# Patient Record
Sex: Male | Born: 1935 | Race: White | Hispanic: No | Marital: Married
Health system: Southern US, Community
[De-identification: ages and names within clinical notes are randomized; demographics above are authoritative.]

## PROBLEM LIST (undated history)

## (undated) DIAGNOSIS — I639 Cerebral infarction, unspecified: Secondary | ICD-10-CM

## (undated) DIAGNOSIS — K219 Gastro-esophageal reflux disease without esophagitis: Secondary | ICD-10-CM

## (undated) DIAGNOSIS — J9690 Respiratory failure, unspecified, unspecified whether with hypoxia or hypercapnia: Secondary | ICD-10-CM

## (undated) DIAGNOSIS — E119 Type 2 diabetes mellitus without complications: Secondary | ICD-10-CM

## (undated) HISTORY — PX: TRACHEOSTOMY: SUR1362

---

## 2009-12-30 DEATH — deceased

## 2017-02-26 ENCOUNTER — Other Ambulatory Visit (HOSPITAL_COMMUNITY): Payer: Medicare Other

## 2017-02-26 ENCOUNTER — Institutional Professional Consult (permissible substitution) (HOSPITAL_COMMUNITY): Payer: Medicare Other

## 2017-02-26 ENCOUNTER — Inpatient Hospital Stay
Admission: RE | Admit: 2017-02-26 | Discharge: 2017-03-30 | Disposition: A | Discharge status: Skilled Nursing Facility | Payer: Medicare Other | Attending: Internal Medicine | Admitting: Internal Medicine

## 2017-02-26 DIAGNOSIS — K567 Ileus, unspecified: Secondary | ICD-10-CM

## 2017-02-26 DIAGNOSIS — R509 Fever, unspecified: Secondary | ICD-10-CM

## 2017-02-26 DIAGNOSIS — N179 Acute kidney failure, unspecified: Secondary | ICD-10-CM

## 2017-02-26 DIAGNOSIS — J189 Pneumonia, unspecified organism: Secondary | ICD-10-CM

## 2017-02-26 DIAGNOSIS — J9690 Respiratory failure, unspecified, unspecified whether with hypoxia or hypercapnia: Secondary | ICD-10-CM

## 2017-02-26 DIAGNOSIS — Z931 Gastrostomy status: Secondary | ICD-10-CM

## 2017-02-26 DIAGNOSIS — R140 Abdominal distension (gaseous): Secondary | ICD-10-CM

## 2017-02-26 DIAGNOSIS — Z431 Encounter for attention to gastrostomy: Secondary | ICD-10-CM

## 2017-02-26 LAB — CBC WITH DIFFERENTIAL/PLATELET
BASOS ABS: 0 10*3/uL (ref 0.0–0.1)
Basophils Relative: 0 %
EOS ABS: 0.1 10*3/uL (ref 0.0–0.7)
Eosinophils Relative: 1 %
HCT: 35.8 % — ABNORMAL LOW (ref 39.0–52.0)
HEMOGLOBIN: 11.4 g/dL — AB (ref 13.0–17.0)
LYMPHS ABS: 1.2 10*3/uL (ref 0.7–4.0)
LYMPHS PCT: 11 %
MCH: 30 pg (ref 26.0–34.0)
MCHC: 31.8 g/dL (ref 30.0–36.0)
MCV: 94.2 fL (ref 78.0–100.0)
Monocytes Absolute: 0.9 10*3/uL (ref 0.1–1.0)
Monocytes Relative: 8 %
NEUTROS PCT: 80 %
Neutro Abs: 8.7 10*3/uL — ABNORMAL HIGH (ref 1.7–7.7)
PLATELETS: 151 10*3/uL (ref 150–400)
RBC: 3.8 MIL/uL — AB (ref 4.22–5.81)
RDW: 13.5 % (ref 11.5–15.5)
WBC: 11 10*3/uL — AB (ref 4.0–10.5)

## 2017-02-26 LAB — PROTIME-INR
INR: 1.34
Prothrombin Time: 16.4 seconds — ABNORMAL HIGH (ref 11.4–15.2)

## 2017-02-26 LAB — COMPREHENSIVE METABOLIC PANEL WITH GFR
ALT: 232 U/L — ABNORMAL HIGH (ref 17–63)
AST: 263 U/L — ABNORMAL HIGH (ref 15–41)
Albumin: 2.4 g/dL — ABNORMAL LOW (ref 3.5–5.0)
Alkaline Phosphatase: 84 U/L (ref 38–126)
Anion gap: 6 (ref 5–15)
BUN: 17 mg/dL (ref 6–20)
CO2: 28 mmol/L (ref 22–32)
Calcium: 8.2 mg/dL — ABNORMAL LOW (ref 8.9–10.3)
Chloride: 111 mmol/L (ref 101–111)
Creatinine, Ser: 0.74 mg/dL (ref 0.61–1.24)
GFR calc Af Amer: >60 mL/min
GFR calc non Af Amer: >60 mL/min
Glucose, Bld: 166 mg/dL — ABNORMAL HIGH (ref 65–99)
Potassium: 3.1 mmol/L — ABNORMAL LOW (ref 3.5–5.1)
Sodium: 145 mmol/L (ref 135–145)
Total Bilirubin: 1.1 mg/dL (ref 0.3–1.2)
Total Protein: 5.9 g/dL — ABNORMAL LOW (ref 6.5–8.1)

## 2017-02-26 LAB — MAGNESIUM: Magnesium: 1.9 mg/dL (ref 1.7–2.4)

## 2017-02-26 LAB — APTT: aPTT: 36 s (ref 24–36)

## 2017-02-26 LAB — PHOSPHORUS: Phosphorus: 3 mg/dL (ref 2.5–4.6)

## 2017-02-26 MED ORDER — IOPAMIDOL (ISOVUE-300) INJECTION 61%
INTRAVENOUS | Status: AC
  Administered 2017-02-26: 30 mL via GASTROSTOMY
  Filled 2017-02-26: qty 50

## 2017-02-27 LAB — COMPREHENSIVE METABOLIC PANEL
ALBUMIN: 2.4 g/dL — AB (ref 3.5–5.0)
ALK PHOS: 83 U/L (ref 38–126)
ALT: 202 U/L — ABNORMAL HIGH (ref 17–63)
ANION GAP: 6 (ref 5–15)
AST: 134 U/L — ABNORMAL HIGH (ref 15–41)
BILIRUBIN TOTAL: 0.7 mg/dL (ref 0.3–1.2)
BUN: 16 mg/dL (ref 6–20)
CALCIUM: 8.3 mg/dL — AB (ref 8.9–10.3)
CO2: 31 mmol/L (ref 22–32)
Chloride: 110 mmol/L (ref 101–111)
Creatinine, Ser: 0.8 mg/dL (ref 0.61–1.24)
GLUCOSE: 189 mg/dL — AB (ref 65–99)
POTASSIUM: 3.3 mmol/L — AB (ref 3.5–5.1)
Sodium: 147 mmol/L — ABNORMAL HIGH (ref 135–145)
TOTAL PROTEIN: 6.1 g/dL — AB (ref 6.5–8.1)

## 2017-02-27 LAB — CBC
HEMATOCRIT: 37.4 % — AB (ref 39.0–52.0)
Hemoglobin: 12 g/dL — ABNORMAL LOW (ref 13.0–17.0)
MCH: 30.8 pg (ref 26.0–34.0)
MCHC: 32.1 g/dL (ref 30.0–36.0)
MCV: 96.1 fL (ref 78.0–100.0)
Platelets: 173 10*3/uL (ref 150–400)
RBC: 3.89 MIL/uL — ABNORMAL LOW (ref 4.22–5.81)
RDW: 13.6 % (ref 11.5–15.5)
WBC: 14.4 10*3/uL — ABNORMAL HIGH (ref 4.0–10.5)

## 2017-02-27 LAB — HEMOGLOBIN A1C
HEMOGLOBIN A1C: 5.9 % — AB (ref 4.8–5.6)
Mean Plasma Glucose: 122.63 mg/dL

## 2017-02-27 LAB — TSH: TSH: 1.026 u[IU]/mL (ref 0.350–4.500)

## 2017-02-27 LAB — MAGNESIUM: MAGNESIUM: 2.1 mg/dL (ref 1.7–2.4)

## 2017-02-27 LAB — PHOSPHORUS: PHOSPHORUS: 2.4 mg/dL — AB (ref 2.5–4.6)

## 2017-02-28 LAB — POTASSIUM: Potassium: 3.1 mmol/L — ABNORMAL LOW (ref 3.5–5.1)

## 2017-02-28 LAB — CBC
HEMATOCRIT: 37.8 % — AB (ref 39.0–52.0)
HEMOGLOBIN: 12.3 g/dL — AB (ref 13.0–17.0)
MCH: 31.5 pg (ref 26.0–34.0)
MCHC: 32.5 g/dL (ref 30.0–36.0)
MCV: 96.7 fL (ref 78.0–100.0)
Platelets: 177 10*3/uL (ref 150–400)
RBC: 3.91 MIL/uL — ABNORMAL LOW (ref 4.22–5.81)
RDW: 13.7 % (ref 11.5–15.5)
WBC: 14.8 10*3/uL — AB (ref 4.0–10.5)

## 2017-02-28 LAB — BASIC METABOLIC PANEL
ANION GAP: 8 (ref 5–15)
BUN: 22 mg/dL — ABNORMAL HIGH (ref 6–20)
CALCIUM: 7.9 mg/dL — AB (ref 8.9–10.3)
CHLORIDE: 110 mmol/L (ref 101–111)
CO2: 30 mmol/L (ref 22–32)
Creatinine, Ser: 1.38 mg/dL — ABNORMAL HIGH (ref 0.61–1.24)
GFR calc non Af Amer: 45 mL/min — ABNORMAL LOW (ref >60)
GFR, EST AFRICAN AMERICAN: 53 mL/min — AB (ref >60)
Glucose, Bld: 173 mg/dL — ABNORMAL HIGH (ref 65–99)
Potassium: 2.9 mmol/L — ABNORMAL LOW (ref 3.5–5.1)
SODIUM: 148 mmol/L — AB (ref 135–145)

## 2017-02-28 LAB — VANCOMYCIN, TROUGH: VANCOMYCIN TR: 37 ug/mL — AB (ref 15–20)

## 2017-02-28 LAB — MAGNESIUM: MAGNESIUM: 2 mg/dL (ref 1.7–2.4)

## 2017-02-28 LAB — PHOSPHORUS: PHOSPHORUS: 2.3 mg/dL — AB (ref 2.5–4.6)

## 2017-02-28 MED ORDER — GENERIC EXTERNAL MEDICATION
1500.00 | Status: DC
Start: 2017-02-26 — End: 2017-02-28

## 2017-02-28 MED ORDER — GLYCOPYRROLATE 0.2 MG/ML IJ SOLN
0.20 | INTRAMUSCULAR | Status: DC
Start: 2017-02-26 — End: 2017-02-28

## 2017-02-28 MED ORDER — LABETALOL HCL 5 MG/ML IV SOLN
10.00 | INTRAVENOUS | Status: DC
Start: ? — End: 2017-02-28

## 2017-02-28 MED ORDER — POLYVINYL ALCOHOL-POVIDONE PF 1.4-0.6 % OP SOLN
1.00 | OPHTHALMIC | Status: DC
Start: ? — End: 2017-02-28

## 2017-02-28 MED ORDER — ACETAMINOPHEN 650 MG RE SUPP
650.00 | RECTAL | Status: DC
Start: ? — End: 2017-02-28

## 2017-02-28 MED ORDER — ONDANSETRON HCL 4 MG/2ML IJ SOLN
4.00 | INTRAMUSCULAR | Status: DC
Start: ? — End: 2017-02-28

## 2017-02-28 MED ORDER — SODIUM CHLORIDE 0.65 % NA SOLN
1.00 | NASAL | Status: DC
Start: ? — End: 2017-02-28

## 2017-02-28 MED ORDER — BISACODYL 10 MG RE SUPP
10.00 | RECTAL | Status: DC
Start: ? — End: 2017-02-28

## 2017-02-28 MED ORDER — FENTANYL CITRATE (PF) 2500 MCG/50ML IJ SOLN
25.00 | INTRAMUSCULAR | Status: DC
Start: ? — End: 2017-02-28

## 2017-02-28 MED ORDER — GENERIC EXTERNAL MEDICATION
25.00 | Status: DC
Start: ? — End: 2017-02-28

## 2017-02-28 MED ORDER — GENERIC EXTERNAL MEDICATION
3.38 | Status: DC
Start: 2017-02-26 — End: 2017-02-28

## 2017-02-28 MED ORDER — ENOXAPARIN SODIUM 40 MG/0.4ML ~~LOC~~ SOLN
40.00 | SUBCUTANEOUS | Status: DC
Start: 2017-02-26 — End: 2017-02-28

## 2017-02-28 MED ORDER — GENERIC EXTERNAL MEDICATION
Status: DC
Start: ? — End: 2017-02-28

## 2017-02-28 MED ORDER — ASPIRIN 300 MG RE SUPP
300.00 | RECTAL | Status: DC
Start: 2017-02-26 — End: 2017-02-28

## 2017-02-28 MED ORDER — VANCOMYCIN HCL 10 G IV SOLR
1.00 | INTRAVENOUS | Status: DC
Start: ? — End: 2017-02-28

## 2017-02-28 MED ORDER — PANTOPRAZOLE SODIUM 40 MG IV SOLR
40.00 | INTRAVENOUS | Status: DC
Start: 2017-02-26 — End: 2017-02-28

## 2017-03-01 LAB — CULTURE, RESPIRATORY W GRAM STAIN: Culture: NORMAL

## 2017-03-01 LAB — BASIC METABOLIC PANEL
Anion gap: 9 (ref 5–15)
BUN: 22 mg/dL — AB (ref 6–20)
CHLORIDE: 111 mmol/L (ref 101–111)
CO2: 27 mmol/L (ref 22–32)
Calcium: 7.9 mg/dL — ABNORMAL LOW (ref 8.9–10.3)
Creatinine, Ser: 1.32 mg/dL — ABNORMAL HIGH (ref 0.61–1.24)
GFR calc Af Amer: 55 mL/min — ABNORMAL LOW (ref >60)
GFR calc non Af Amer: 48 mL/min — ABNORMAL LOW (ref >60)
GLUCOSE: 178 mg/dL — AB (ref 65–99)
POTASSIUM: 3 mmol/L — AB (ref 3.5–5.1)
Sodium: 147 mmol/L — ABNORMAL HIGH (ref 135–145)

## 2017-03-01 LAB — CULTURE, RESPIRATORY: SPECIAL REQUESTS: NORMAL

## 2017-03-01 LAB — VANCOMYCIN, TROUGH: Vancomycin Tr: 14 ug/mL — ABNORMAL LOW (ref 15–20)

## 2017-03-02 LAB — BASIC METABOLIC PANEL
ANION GAP: 5 (ref 5–15)
BUN: 24 mg/dL — ABNORMAL HIGH (ref 6–20)
CO2: 33 mmol/L — ABNORMAL HIGH (ref 22–32)
Calcium: 7.9 mg/dL — ABNORMAL LOW (ref 8.9–10.3)
Chloride: 114 mmol/L — ABNORMAL HIGH (ref 101–111)
Creatinine, Ser: 1.45 mg/dL — ABNORMAL HIGH (ref 0.61–1.24)
GFR calc Af Amer: 49 mL/min — ABNORMAL LOW (ref >60)
GFR, EST NON AFRICAN AMERICAN: 43 mL/min — AB (ref >60)
GLUCOSE: 144 mg/dL — AB (ref 65–99)
POTASSIUM: 3 mmol/L — AB (ref 3.5–5.1)
Sodium: 152 mmol/L — ABNORMAL HIGH (ref 135–145)

## 2017-03-03 LAB — BASIC METABOLIC PANEL
Anion gap: 6 (ref 5–15)
BUN: 23 mg/dL — ABNORMAL HIGH (ref 6–20)
CO2: 31 mmol/L (ref 22–32)
CREATININE: 1.54 mg/dL — AB (ref 0.61–1.24)
Calcium: 7.6 mg/dL — ABNORMAL LOW (ref 8.9–10.3)
Chloride: 110 mmol/L (ref 101–111)
GFR calc Af Amer: 46 mL/min — ABNORMAL LOW (ref >60)
GFR calc non Af Amer: 40 mL/min — ABNORMAL LOW (ref >60)
GLUCOSE: 219 mg/dL — AB (ref 65–99)
Potassium: 2.8 mmol/L — ABNORMAL LOW (ref 3.5–5.1)
Sodium: 147 mmol/L — ABNORMAL HIGH (ref 135–145)

## 2017-03-03 LAB — CBC
HEMATOCRIT: 36.1 % — AB (ref 39.0–52.0)
Hemoglobin: 11.3 g/dL — ABNORMAL LOW (ref 13.0–17.0)
MCH: 30.4 pg (ref 26.0–34.0)
MCHC: 31.3 g/dL (ref 30.0–36.0)
MCV: 97 fL (ref 78.0–100.0)
Platelets: 208 10*3/uL (ref 150–400)
RBC: 3.72 MIL/uL — ABNORMAL LOW (ref 4.22–5.81)
RDW: 14 % (ref 11.5–15.5)
WBC: 9.3 10*3/uL (ref 4.0–10.5)

## 2017-03-04 LAB — COMPREHENSIVE METABOLIC PANEL
ALBUMIN: 2.4 g/dL — AB (ref 3.5–5.0)
ALK PHOS: 63 U/L (ref 38–126)
ALT: 110 U/L — AB (ref 17–63)
AST: 90 U/L — AB (ref 15–41)
Anion gap: 6 (ref 5–15)
BUN: 22 mg/dL — AB (ref 6–20)
CALCIUM: 7.9 mg/dL — AB (ref 8.9–10.3)
CO2: 28 mmol/L (ref 22–32)
CREATININE: 1.54 mg/dL — AB (ref 0.61–1.24)
Chloride: 115 mmol/L — ABNORMAL HIGH (ref 101–111)
GFR calc non Af Amer: 40 mL/min — ABNORMAL LOW (ref >60)
GFR, EST AFRICAN AMERICAN: 46 mL/min — AB (ref >60)
GLUCOSE: 182 mg/dL — AB (ref 65–99)
Potassium: 3.2 mmol/L — ABNORMAL LOW (ref 3.5–5.1)
SODIUM: 149 mmol/L — AB (ref 135–145)
Total Bilirubin: 0.8 mg/dL (ref 0.3–1.2)
Total Protein: 6.7 g/dL (ref 6.5–8.1)

## 2017-03-04 LAB — POTASSIUM: Potassium: 3.1 mmol/L — ABNORMAL LOW (ref 3.5–5.1)

## 2017-03-05 LAB — BASIC METABOLIC PANEL
Anion gap: 5 (ref 5–15)
BUN: 25 mg/dL — AB (ref 6–20)
CHLORIDE: 116 mmol/L — AB (ref 101–111)
CO2: 31 mmol/L (ref 22–32)
CREATININE: 1.63 mg/dL — AB (ref 0.61–1.24)
Calcium: 8 mg/dL — ABNORMAL LOW (ref 8.9–10.3)
GFR calc Af Amer: 43 mL/min — ABNORMAL LOW (ref >60)
GFR calc non Af Amer: 37 mL/min — ABNORMAL LOW (ref >60)
Glucose, Bld: 145 mg/dL — ABNORMAL HIGH (ref 65–99)
Potassium: 3.7 mmol/L (ref 3.5–5.1)
Sodium: 152 mmol/L — ABNORMAL HIGH (ref 135–145)

## 2017-03-05 LAB — CULTURE, RESPIRATORY W GRAM STAIN

## 2017-03-05 LAB — CULTURE, RESPIRATORY: CULTURE: NORMAL

## 2017-03-05 LAB — VANCOMYCIN, TROUGH: Vancomycin Tr: 6 ug/mL — ABNORMAL LOW (ref 15–20)

## 2017-03-06 LAB — BASIC METABOLIC PANEL
Anion gap: 11 (ref 5–15)
Anion gap: 9 (ref 5–15)
BUN: 24 mg/dL — ABNORMAL HIGH (ref 6–20)
BUN: 25 mg/dL — AB (ref 6–20)
CALCIUM: 8.1 mg/dL — AB (ref 8.9–10.3)
CALCIUM: 8.1 mg/dL — AB (ref 8.9–10.3)
CHLORIDE: 109 mmol/L (ref 101–111)
CO2: 33 mmol/L — AB (ref 22–32)
CO2: 29 mmol/L (ref 22–32)
CREATININE: 1.53 mg/dL — AB (ref 0.61–1.24)
CREATININE: 1.51 mg/dL — AB (ref 0.61–1.24)
Chloride: 119 mmol/L — ABNORMAL HIGH (ref 101–111)
GFR calc Af Amer: 46 mL/min — ABNORMAL LOW (ref >60)
GFR calc Af Amer: 47 mL/min — ABNORMAL LOW (ref >60)
GFR calc non Af Amer: 40 mL/min — ABNORMAL LOW (ref >60)
GFR calc non Af Amer: 41 mL/min — ABNORMAL LOW (ref >60)
GLUCOSE: 124 mg/dL — AB (ref 65–99)
Glucose, Bld: 154 mg/dL — ABNORMAL HIGH (ref 65–99)
Potassium: 3.9 mmol/L (ref 3.5–5.1)
Potassium: 3.3 mmol/L — ABNORMAL LOW (ref 3.5–5.1)
Sodium: 163 mmol/L (ref 135–145)
Sodium: 147 mmol/L — ABNORMAL HIGH (ref 135–145)

## 2017-03-06 LAB — CBC
HEMATOCRIT: 37 % — AB (ref 39.0–52.0)
HEMOGLOBIN: 11.5 g/dL — AB (ref 13.0–17.0)
MCH: 30.3 pg (ref 26.0–34.0)
MCHC: 31.1 g/dL (ref 30.0–36.0)
MCV: 97.6 fL (ref 78.0–100.0)
Platelets: 217 10*3/uL (ref 150–400)
RBC: 3.79 MIL/uL — ABNORMAL LOW (ref 4.22–5.81)
RDW: 13.8 % (ref 11.5–15.5)
WBC: 9.8 10*3/uL (ref 4.0–10.5)

## 2017-03-06 LAB — MAGNESIUM: MAGNESIUM: 2.7 mg/dL — AB (ref 1.7–2.4)

## 2017-03-07 LAB — BASIC METABOLIC PANEL
Anion gap: 9 (ref 5–15)
BUN: 30 mg/dL — AB (ref 6–20)
CALCIUM: 8 mg/dL — AB (ref 8.9–10.3)
CO2: 30 mmol/L (ref 22–32)
CREATININE: 1.58 mg/dL — AB (ref 0.61–1.24)
Chloride: 109 mmol/L (ref 101–111)
GFR calc Af Amer: 45 mL/min — ABNORMAL LOW (ref >60)
GFR, EST NON AFRICAN AMERICAN: 38 mL/min — AB (ref >60)
GLUCOSE: 142 mg/dL — AB (ref 65–99)
POTASSIUM: 3.7 mmol/L (ref 3.5–5.1)
SODIUM: 148 mmol/L — AB (ref 135–145)

## 2017-03-08 ENCOUNTER — Other Ambulatory Visit (HOSPITAL_COMMUNITY): Payer: Medicare Other

## 2017-03-08 LAB — MAGNESIUM: Magnesium: 2.7 mg/dL — ABNORMAL HIGH (ref 1.7–2.4)

## 2017-03-08 LAB — RENAL FUNCTION PANEL
ANION GAP: 11 (ref 5–15)
Albumin: 2.6 g/dL — ABNORMAL LOW (ref 3.5–5.0)
BUN: 32 mg/dL — ABNORMAL HIGH (ref 6–20)
CALCIUM: 8.2 mg/dL — AB (ref 8.9–10.3)
CHLORIDE: 108 mmol/L (ref 101–111)
CO2: 31 mmol/L (ref 22–32)
Creatinine, Ser: 1.77 mg/dL — ABNORMAL HIGH (ref 0.61–1.24)
GFR calc Af Amer: 39 mL/min — ABNORMAL LOW (ref >60)
GFR calc non Af Amer: 34 mL/min — ABNORMAL LOW (ref >60)
Glucose, Bld: 133 mg/dL — ABNORMAL HIGH (ref 65–99)
Phosphorus: 4.4 mg/dL (ref 2.5–4.6)
Potassium: 3.5 mmol/L (ref 3.5–5.1)
SODIUM: 150 mmol/L — AB (ref 135–145)

## 2017-03-08 LAB — CBC
HCT: 39.9 % (ref 39.0–52.0)
Hemoglobin: 12.6 g/dL — ABNORMAL LOW (ref 13.0–17.0)
MCH: 31 pg (ref 26.0–34.0)
MCHC: 31.6 g/dL (ref 30.0–36.0)
MCV: 98.3 fL (ref 78.0–100.0)
Platelets: 228 10*3/uL (ref 150–400)
RBC: 4.06 MIL/uL — ABNORMAL LOW (ref 4.22–5.81)
RDW: 13.9 % (ref 11.5–15.5)
WBC: 10.2 10*3/uL (ref 4.0–10.5)

## 2017-03-09 ENCOUNTER — Other Ambulatory Visit (HOSPITAL_COMMUNITY): Payer: Medicare Other

## 2017-03-09 LAB — RENAL FUNCTION PANEL
ALBUMIN: 2.5 g/dL — AB (ref 3.5–5.0)
Anion gap: 11 (ref 5–15)
BUN: 31 mg/dL — ABNORMAL HIGH (ref 6–20)
CALCIUM: 8.1 mg/dL — AB (ref 8.9–10.3)
CO2: 29 mmol/L (ref 22–32)
Chloride: 106 mmol/L (ref 101–111)
Creatinine, Ser: 1.87 mg/dL — ABNORMAL HIGH (ref 0.61–1.24)
GFR, EST AFRICAN AMERICAN: 36 mL/min — AB (ref >60)
GFR, EST NON AFRICAN AMERICAN: 31 mL/min — AB (ref >60)
GLUCOSE: 148 mg/dL — AB (ref 65–99)
PHOSPHORUS: 4.3 mg/dL (ref 2.5–4.6)
Potassium: 3.2 mmol/L — ABNORMAL LOW (ref 3.5–5.1)
SODIUM: 146 mmol/L — AB (ref 135–145)

## 2017-03-09 LAB — CBC
HCT: 37.5 % — ABNORMAL LOW (ref 39.0–52.0)
HEMOGLOBIN: 11.5 g/dL — AB (ref 13.0–17.0)
MCH: 29.9 pg (ref 26.0–34.0)
MCHC: 30.7 g/dL (ref 30.0–36.0)
MCV: 97.4 fL (ref 78.0–100.0)
Platelets: 206 10*3/uL (ref 150–400)
RBC: 3.85 MIL/uL — AB (ref 4.22–5.81)
RDW: 13.8 % (ref 11.5–15.5)
WBC: 8.4 10*3/uL (ref 4.0–10.5)

## 2017-03-09 LAB — MAGNESIUM: Magnesium: 2.5 mg/dL — ABNORMAL HIGH (ref 1.7–2.4)

## 2017-03-09 MED ORDER — IOPAMIDOL (ISOVUE-300) INJECTION 61%
INTRAVENOUS | Status: AC
  Administered 2017-03-09: 35 mL via JEJUNOSTOMY
  Filled 2017-03-09: qty 50

## 2017-03-10 LAB — BASIC METABOLIC PANEL
ANION GAP: 9 (ref 5–15)
BUN: 29 mg/dL — ABNORMAL HIGH (ref 6–20)
CALCIUM: 8.1 mg/dL — AB (ref 8.9–10.3)
CO2: 30 mmol/L (ref 22–32)
Chloride: 107 mmol/L (ref 101–111)
Creatinine, Ser: 2.13 mg/dL — ABNORMAL HIGH (ref 0.61–1.24)
GFR, EST AFRICAN AMERICAN: 31 mL/min — AB (ref >60)
GFR, EST NON AFRICAN AMERICAN: 27 mL/min — AB (ref >60)
Glucose, Bld: 122 mg/dL — ABNORMAL HIGH (ref 65–99)
POTASSIUM: 3.9 mmol/L (ref 3.5–5.1)
SODIUM: 146 mmol/L — AB (ref 135–145)

## 2017-03-11 ENCOUNTER — Encounter (HOSPITAL_COMMUNITY): Payer: Self-pay | Admitting: Interventional Radiology

## 2017-03-11 ENCOUNTER — Other Ambulatory Visit (HOSPITAL_COMMUNITY): Payer: Medicare Other

## 2017-03-11 HISTORY — PX: IR REPLC DUODEN/JEJUNO TUBE PERCUT W/FLUORO: IMG2334

## 2017-03-11 LAB — RENAL FUNCTION PANEL
ALBUMIN: 2.5 g/dL — AB (ref 3.5–5.0)
ANION GAP: 12 (ref 5–15)
BUN: 24 mg/dL — AB (ref 6–20)
CHLORIDE: 103 mmol/L (ref 101–111)
CO2: 28 mmol/L (ref 22–32)
Calcium: 8.2 mg/dL — ABNORMAL LOW (ref 8.9–10.3)
Creatinine, Ser: 2.01 mg/dL — ABNORMAL HIGH (ref 0.61–1.24)
GFR calc Af Amer: 33 mL/min — ABNORMAL LOW (ref >60)
GFR calc non Af Amer: 29 mL/min — ABNORMAL LOW (ref >60)
Glucose, Bld: 129 mg/dL — ABNORMAL HIGH (ref 65–99)
PHOSPHORUS: 3.5 mg/dL (ref 2.5–4.6)
POTASSIUM: 3.5 mmol/L (ref 3.5–5.1)
Sodium: 143 mmol/L (ref 135–145)

## 2017-03-11 LAB — CBC
HEMATOCRIT: 35.8 % — AB (ref 39.0–52.0)
Hemoglobin: 11.1 g/dL — ABNORMAL LOW (ref 13.0–17.0)
MCH: 30 pg (ref 26.0–34.0)
MCHC: 31 g/dL (ref 30.0–36.0)
MCV: 96.8 fL (ref 78.0–100.0)
PLATELETS: 194 10*3/uL (ref 150–400)
RBC: 3.7 MIL/uL — AB (ref 4.22–5.81)
RDW: 13.6 % (ref 11.5–15.5)
WBC: 7.4 10*3/uL (ref 4.0–10.5)

## 2017-03-11 LAB — MAGNESIUM: Magnesium: 2.4 mg/dL (ref 1.7–2.4)

## 2017-03-11 MED ORDER — LIDOCAINE HCL 1 % IJ SOLN
INTRAMUSCULAR | Status: AC
  Filled 2017-03-11: qty 20

## 2017-03-11 MED ORDER — LIDOCAINE VISCOUS 2 % MT SOLN
OROMUCOSAL | Status: AC
  Filled 2017-03-11: qty 15

## 2017-03-11 MED ORDER — IOPAMIDOL (ISOVUE-300) INJECTION 61%
INTRAVENOUS | Status: AC
  Administered 2017-03-11: 15 mL
  Filled 2017-03-11: qty 50

## 2017-03-11 MED ORDER — IOPAMIDOL (ISOVUE-300) INJECTION 61%
INTRAVENOUS | Status: AC
  Filled 2017-03-11: qty 50

## 2017-03-11 MED ORDER — LIDOCAINE VISCOUS 2 % MT SOLN
OROMUCOSAL | Status: DC | PRN
  Administered 2017-03-11: 15 mL via OROMUCOSAL

## 2017-03-12 ENCOUNTER — Other Ambulatory Visit (HOSPITAL_COMMUNITY): Payer: Medicare Other

## 2017-03-12 LAB — CBC
HCT: 34.5 % — ABNORMAL LOW (ref 39.0–52.0)
HEMOGLOBIN: 11.4 g/dL — AB (ref 13.0–17.0)
MCH: 31.4 pg (ref 26.0–34.0)
MCHC: 33 g/dL (ref 30.0–36.0)
MCV: 95 fL (ref 78.0–100.0)
PLATELETS: 178 10*3/uL (ref 150–400)
RBC: 3.63 MIL/uL — AB (ref 4.22–5.81)
RDW: 13.5 % (ref 11.5–15.5)
WBC: 6.6 10*3/uL (ref 4.0–10.5)

## 2017-03-12 LAB — RENAL FUNCTION PANEL
ALBUMIN: 2.3 g/dL — AB (ref 3.5–5.0)
Anion gap: 7 (ref 5–15)
BUN: 21 mg/dL — AB (ref 6–20)
CALCIUM: 8.1 mg/dL — AB (ref 8.9–10.3)
CO2: 32 mmol/L (ref 22–32)
CREATININE: 1.87 mg/dL — AB (ref 0.61–1.24)
Chloride: 103 mmol/L (ref 101–111)
GFR, EST AFRICAN AMERICAN: 36 mL/min — AB (ref >60)
GFR, EST NON AFRICAN AMERICAN: 31 mL/min — AB (ref >60)
Glucose, Bld: 138 mg/dL — ABNORMAL HIGH (ref 65–99)
PHOSPHORUS: 3.8 mg/dL (ref 2.5–4.6)
Potassium: 3.5 mmol/L (ref 3.5–5.1)
SODIUM: 142 mmol/L (ref 135–145)

## 2017-03-12 LAB — MAGNESIUM: MAGNESIUM: 2.4 mg/dL (ref 1.7–2.4)

## 2017-03-13 ENCOUNTER — Other Ambulatory Visit (HOSPITAL_COMMUNITY): Payer: Medicare Other

## 2017-03-13 LAB — RENAL FUNCTION PANEL
ALBUMIN: 2.5 g/dL — AB (ref 3.5–5.0)
ANION GAP: 11 (ref 5–15)
BUN: 25 mg/dL — AB (ref 6–20)
CHLORIDE: 100 mmol/L — AB (ref 101–111)
CO2: 28 mmol/L (ref 22–32)
Calcium: 8.1 mg/dL — ABNORMAL LOW (ref 8.9–10.3)
Creatinine, Ser: 1.96 mg/dL — ABNORMAL HIGH (ref 0.61–1.24)
GFR calc Af Amer: 34 mL/min — ABNORMAL LOW (ref >60)
GFR calc non Af Amer: 30 mL/min — ABNORMAL LOW (ref >60)
GLUCOSE: 113 mg/dL — AB (ref 65–99)
POTASSIUM: 3.3 mmol/L — AB (ref 3.5–5.1)
Phosphorus: 4.2 mg/dL (ref 2.5–4.6)
Sodium: 139 mmol/L (ref 135–145)

## 2017-03-13 LAB — MAGNESIUM: Magnesium: 2.3 mg/dL (ref 1.7–2.4)

## 2017-03-13 LAB — HEPARIN INDUCED PLATELET AB (HIT ANTIBODY): Heparin Induced Plt Ab: 0.42 OD — ABNORMAL HIGH (ref 0.000–0.400)

## 2017-03-14 ENCOUNTER — Other Ambulatory Visit (HOSPITAL_COMMUNITY): Payer: Medicare Other

## 2017-03-14 LAB — CBC WITH DIFFERENTIAL/PLATELET
BASOS ABS: 0 10*3/uL (ref 0.0–0.1)
Basophils Relative: 0 %
EOS PCT: 1 %
Eosinophils Absolute: 0.1 10*3/uL (ref 0.0–0.7)
HCT: 34.9 % — ABNORMAL LOW (ref 39.0–52.0)
HEMOGLOBIN: 11.7 g/dL — AB (ref 13.0–17.0)
LYMPHS ABS: 1.2 10*3/uL (ref 0.7–4.0)
Lymphocytes Relative: 11 %
MCH: 31.2 pg (ref 26.0–34.0)
MCHC: 33.5 g/dL (ref 30.0–36.0)
MCV: 93.1 fL (ref 78.0–100.0)
Monocytes Absolute: 0.9 10*3/uL (ref 0.1–1.0)
Monocytes Relative: 8 %
NEUTROS PCT: 80 %
Neutro Abs: 8.4 10*3/uL — ABNORMAL HIGH (ref 1.7–7.7)
PLATELETS: 179 10*3/uL (ref 150–400)
RBC: 3.75 MIL/uL — AB (ref 4.22–5.81)
RDW: 13.7 % (ref 11.5–15.5)
WBC: 10.6 10*3/uL — AB (ref 4.0–10.5)

## 2017-03-14 LAB — MAGNESIUM: MAGNESIUM: 2.1 mg/dL (ref 1.7–2.4)

## 2017-03-14 LAB — URINALYSIS, ROUTINE W REFLEX MICROSCOPIC
BILIRUBIN URINE: NEGATIVE
Glucose, UA: NEGATIVE mg/dL
Hgb urine dipstick: NEGATIVE
KETONES UR: NEGATIVE mg/dL
Leukocytes, UA: NEGATIVE
NITRITE: NEGATIVE
Protein, ur: NEGATIVE mg/dL
SPECIFIC GRAVITY, URINE: 1.009 (ref 1.005–1.030)
pH: 7 (ref 5.0–8.0)

## 2017-03-14 LAB — RENAL FUNCTION PANEL
ALBUMIN: 2.2 g/dL — AB (ref 3.5–5.0)
ANION GAP: 8 (ref 5–15)
BUN: 28 mg/dL — ABNORMAL HIGH (ref 6–20)
CALCIUM: 7.8 mg/dL — AB (ref 8.9–10.3)
CO2: 27 mmol/L (ref 22–32)
CREATININE: 2.1 mg/dL — AB (ref 0.61–1.24)
Chloride: 103 mmol/L (ref 101–111)
GFR, EST AFRICAN AMERICAN: 32 mL/min — AB (ref >60)
GFR, EST NON AFRICAN AMERICAN: 27 mL/min — AB (ref >60)
Glucose, Bld: 149 mg/dL — ABNORMAL HIGH (ref 65–99)
PHOSPHORUS: 3.6 mg/dL (ref 2.5–4.6)
Potassium: 3.4 mmol/L — ABNORMAL LOW (ref 3.5–5.1)
SODIUM: 138 mmol/L (ref 135–145)

## 2017-03-15 ENCOUNTER — Other Ambulatory Visit (HOSPITAL_COMMUNITY): Payer: Medicare Other

## 2017-03-15 LAB — COMPREHENSIVE METABOLIC PANEL
ALK PHOS: 76 U/L (ref 38–126)
ALT: 27 U/L (ref 17–63)
AST: 30 U/L (ref 15–41)
Albumin: 2.4 g/dL — ABNORMAL LOW (ref 3.5–5.0)
Anion gap: 11 (ref 5–15)
BUN: 21 mg/dL — AB (ref 6–20)
CALCIUM: 7.9 mg/dL — AB (ref 8.9–10.3)
CO2: 24 mmol/L (ref 22–32)
CREATININE: 1.77 mg/dL — AB (ref 0.61–1.24)
Chloride: 105 mmol/L (ref 101–111)
GFR calc non Af Amer: 34 mL/min — ABNORMAL LOW (ref >60)
GFR, EST AFRICAN AMERICAN: 39 mL/min — AB (ref >60)
GLUCOSE: 152 mg/dL — AB (ref 65–99)
Potassium: 3.3 mmol/L — ABNORMAL LOW (ref 3.5–5.1)
SODIUM: 140 mmol/L (ref 135–145)
Total Bilirubin: 0.8 mg/dL (ref 0.3–1.2)
Total Protein: 6.9 g/dL (ref 6.5–8.1)

## 2017-03-15 LAB — CBC
HCT: 35.5 % — ABNORMAL LOW (ref 39.0–52.0)
Hemoglobin: 11.9 g/dL — ABNORMAL LOW (ref 13.0–17.0)
MCH: 31 pg (ref 26.0–34.0)
MCHC: 33.5 g/dL (ref 30.0–36.0)
MCV: 92.4 fL (ref 78.0–100.0)
PLATELETS: 209 10*3/uL (ref 150–400)
RBC: 3.84 MIL/uL — AB (ref 4.22–5.81)
RDW: 13.6 % (ref 11.5–15.5)
WBC: 13.5 10*3/uL — ABNORMAL HIGH (ref 4.0–10.5)

## 2017-03-15 LAB — LACTIC ACID, PLASMA
LACTIC ACID, VENOUS: 2.2 mmol/L — AB (ref 0.5–1.9)
Lactic Acid, Venous: 2.4 mmol/L (ref 0.5–1.9)
Lactic Acid, Venous: 1.3 mmol/L (ref 0.5–1.9)
Lactic Acid, Venous: 1.5 mmol/L (ref 0.5–1.9)

## 2017-03-15 LAB — MAGNESIUM: Magnesium: 1.7 mg/dL (ref 1.7–2.4)

## 2017-03-16 ENCOUNTER — Other Ambulatory Visit (HOSPITAL_COMMUNITY): Payer: Medicare Other

## 2017-03-16 LAB — BASIC METABOLIC PANEL
ANION GAP: 8 (ref 5–15)
BUN: 17 mg/dL (ref 6–20)
CALCIUM: 7.6 mg/dL — AB (ref 8.9–10.3)
CO2: 26 mmol/L (ref 22–32)
Chloride: 108 mmol/L (ref 101–111)
Creatinine, Ser: 1.52 mg/dL — ABNORMAL HIGH (ref 0.61–1.24)
GFR calc Af Amer: 47 mL/min — ABNORMAL LOW (ref >60)
GFR, EST NON AFRICAN AMERICAN: 40 mL/min — AB (ref >60)
GLUCOSE: 151 mg/dL — AB (ref 65–99)
Potassium: 3.3 mmol/L — ABNORMAL LOW (ref 3.5–5.1)
Sodium: 142 mmol/L (ref 135–145)

## 2017-03-16 LAB — CBC
HCT: 30.8 % — ABNORMAL LOW (ref 39.0–52.0)
Hemoglobin: 10.2 g/dL — ABNORMAL LOW (ref 13.0–17.0)
MCH: 31 pg (ref 26.0–34.0)
MCHC: 33.1 g/dL (ref 30.0–36.0)
MCV: 93.6 fL (ref 78.0–100.0)
PLATELETS: 152 10*3/uL (ref 150–400)
RBC: 3.29 MIL/uL — ABNORMAL LOW (ref 4.22–5.81)
RDW: 14 % (ref 11.5–15.5)
WBC: 11.1 10*3/uL — AB (ref 4.0–10.5)

## 2017-03-16 LAB — URINE CULTURE: SPECIAL REQUESTS: NORMAL

## 2017-03-16 LAB — AMYLASE: Amylase: 44 U/L (ref 28–100)

## 2017-03-16 LAB — LIPASE, BLOOD: Lipase: 33 U/L (ref 11–51)

## 2017-03-16 LAB — MAGNESIUM: MAGNESIUM: 2.1 mg/dL (ref 1.7–2.4)

## 2017-03-16 LAB — LACTIC ACID, PLASMA: Lactic Acid, Venous: 1.2 mmol/L (ref 0.5–1.9)

## 2017-03-17 LAB — BASIC METABOLIC PANEL
Anion gap: 8 (ref 5–15)
BUN: 11 mg/dL (ref 6–20)
CHLORIDE: 110 mmol/L (ref 101–111)
CO2: 27 mmol/L (ref 22–32)
Calcium: 7.6 mg/dL — ABNORMAL LOW (ref 8.9–10.3)
Creatinine, Ser: 1.33 mg/dL — ABNORMAL HIGH (ref 0.61–1.24)
GFR calc non Af Amer: 47 mL/min — ABNORMAL LOW (ref >60)
GFR, EST AFRICAN AMERICAN: 55 mL/min — AB (ref >60)
Glucose, Bld: 146 mg/dL — ABNORMAL HIGH (ref 65–99)
POTASSIUM: 3.6 mmol/L (ref 3.5–5.1)
SODIUM: 145 mmol/L (ref 135–145)

## 2017-03-17 LAB — CBC
HEMATOCRIT: 32.4 % — AB (ref 39.0–52.0)
Hemoglobin: 10.2 g/dL — ABNORMAL LOW (ref 13.0–17.0)
MCH: 30.3 pg (ref 26.0–34.0)
MCHC: 31.5 g/dL (ref 30.0–36.0)
MCV: 96.1 fL (ref 78.0–100.0)
Platelets: 143 10*3/uL — ABNORMAL LOW (ref 150–400)
RBC: 3.37 MIL/uL — AB (ref 4.22–5.81)
RDW: 13.8 % (ref 11.5–15.5)
WBC: 6.4 10*3/uL (ref 4.0–10.5)

## 2017-03-17 LAB — MAGNESIUM: Magnesium: 1.8 mg/dL (ref 1.7–2.4)

## 2017-03-18 ENCOUNTER — Other Ambulatory Visit (HOSPITAL_COMMUNITY): Payer: Medicare Other

## 2017-03-18 LAB — BASIC METABOLIC PANEL
Anion gap: 10 (ref 5–15)
BUN: 9 mg/dL (ref 6–20)
CALCIUM: 7.6 mg/dL — AB (ref 8.9–10.3)
CO2: 28 mmol/L (ref 22–32)
Chloride: 109 mmol/L (ref 101–111)
Creatinine, Ser: 1.15 mg/dL (ref 0.61–1.24)
GFR calc Af Amer: >60 mL/min (ref >60)
GFR, EST NON AFRICAN AMERICAN: 57 mL/min — AB (ref >60)
GLUCOSE: 121 mg/dL — AB (ref 65–99)
Potassium: 3.5 mmol/L (ref 3.5–5.1)
SODIUM: 147 mmol/L — AB (ref 135–145)

## 2017-03-18 LAB — CULTURE, RESPIRATORY W GRAM STAIN

## 2017-03-18 LAB — CULTURE, RESPIRATORY

## 2017-03-20 LAB — CBC WITH DIFFERENTIAL/PLATELET
BASOS ABS: 0 10*3/uL (ref 0.0–0.1)
BASOS PCT: 0 %
EOS PCT: 2 %
Eosinophils Absolute: 0.1 10*3/uL (ref 0.0–0.7)
HEMATOCRIT: 28.8 % — AB (ref 39.0–52.0)
Hemoglobin: 9.3 g/dL — ABNORMAL LOW (ref 13.0–17.0)
LYMPHS PCT: 18 %
Lymphs Abs: 1.2 10*3/uL (ref 0.7–4.0)
MCH: 30.5 pg (ref 26.0–34.0)
MCHC: 32.3 g/dL (ref 30.0–36.0)
MCV: 94.4 fL (ref 78.0–100.0)
MONO ABS: 0.5 10*3/uL (ref 0.1–1.0)
MONOS PCT: 8 %
NEUTROS ABS: 4.8 10*3/uL (ref 1.7–7.7)
Neutrophils Relative %: 72 %
PLATELETS: 152 10*3/uL (ref 150–400)
RBC: 3.05 MIL/uL — ABNORMAL LOW (ref 4.22–5.81)
RDW: 13.6 % (ref 11.5–15.5)
WBC: 6.6 10*3/uL (ref 4.0–10.5)

## 2017-03-20 LAB — POTASSIUM: Potassium: 3.8 mmol/L (ref 3.5–5.1)

## 2017-03-20 LAB — BASIC METABOLIC PANEL
Anion gap: 9 (ref 5–15)
BUN: 9 mg/dL (ref 6–20)
CALCIUM: 7.2 mg/dL — AB (ref 8.9–10.3)
CO2: 26 mmol/L (ref 22–32)
Chloride: 106 mmol/L (ref 101–111)
Creatinine, Ser: 1.04 mg/dL (ref 0.61–1.24)
GFR calc Af Amer: >60 mL/min (ref >60)
GLUCOSE: 148 mg/dL — AB (ref 65–99)
Potassium: 2.7 mmol/L — CL (ref 3.5–5.1)
Sodium: 141 mmol/L (ref 135–145)

## 2017-03-20 LAB — MAGNESIUM: Magnesium: 1.5 mg/dL — ABNORMAL LOW (ref 1.7–2.4)

## 2017-03-21 LAB — BASIC METABOLIC PANEL
Anion gap: 10 (ref 5–15)
BUN: 7 mg/dL (ref 6–20)
CALCIUM: 7.4 mg/dL — AB (ref 8.9–10.3)
CO2: 26 mmol/L (ref 22–32)
Chloride: 105 mmol/L (ref 101–111)
Creatinine, Ser: 1.03 mg/dL (ref 0.61–1.24)
GFR calc Af Amer: >60 mL/min (ref >60)
GLUCOSE: 137 mg/dL — AB (ref 65–99)
Potassium: 3.2 mmol/L — ABNORMAL LOW (ref 3.5–5.1)
SODIUM: 141 mmol/L (ref 135–145)

## 2017-03-21 LAB — CULTURE, BLOOD (ROUTINE X 2): Culture: NO GROWTH

## 2017-03-21 LAB — CBC WITH DIFFERENTIAL/PLATELET
BASOS ABS: 0 10*3/uL (ref 0.0–0.1)
Basophils Relative: 0 %
EOS ABS: 0.1 10*3/uL (ref 0.0–0.7)
EOS PCT: 1 %
HCT: 30.1 % — ABNORMAL LOW (ref 39.0–52.0)
Hemoglobin: 9.7 g/dL — ABNORMAL LOW (ref 13.0–17.0)
LYMPHS ABS: 1.5 10*3/uL (ref 0.7–4.0)
Lymphocytes Relative: 22 %
MCH: 30.1 pg (ref 26.0–34.0)
MCHC: 32.2 g/dL (ref 30.0–36.0)
MCV: 93.5 fL (ref 78.0–100.0)
MONOS PCT: 7 %
Monocytes Absolute: 0.5 10*3/uL (ref 0.1–1.0)
NEUTROS ABS: 4.7 10*3/uL (ref 1.7–7.7)
Neutrophils Relative %: 70 %
Platelets: 145 10*3/uL — ABNORMAL LOW (ref 150–400)
RBC: 3.22 MIL/uL — ABNORMAL LOW (ref 4.22–5.81)
RDW: 13.5 % (ref 11.5–15.5)
WBC: 6.7 10*3/uL (ref 4.0–10.5)

## 2017-03-21 LAB — VANCOMYCIN, TROUGH: Vancomycin Tr: <4 ug/mL — ABNORMAL LOW (ref 15–20)

## 2017-03-21 LAB — MAGNESIUM: Magnesium: 1.9 mg/dL (ref 1.7–2.4)

## 2017-03-22 ENCOUNTER — Other Ambulatory Visit (HOSPITAL_COMMUNITY): Payer: Medicare Other

## 2017-03-22 LAB — MAGNESIUM: Magnesium: 1.9 mg/dL (ref 1.7–2.4)

## 2017-03-22 LAB — BLOOD GAS, ARTERIAL
ACID-BASE EXCESS: 3.4 mmol/L — AB (ref 0.0–2.0)
BICARBONATE: 28 mmol/L (ref 20.0–28.0)
FIO2: 28
O2 SAT: 95 %
PATIENT TEMPERATURE: 98.6
PO2 ART: 75.5 mmHg — AB (ref 83.0–108.0)
pCO2 arterial: 46.8 mmHg (ref 32.0–48.0)
pH, Arterial: 7.394 (ref 7.350–7.450)

## 2017-03-22 LAB — RENAL FUNCTION PANEL
ALBUMIN: 2.2 g/dL — AB (ref 3.5–5.0)
Anion gap: 10 (ref 5–15)
BUN: 6 mg/dL (ref 6–20)
CHLORIDE: 106 mmol/L (ref 101–111)
CO2: 27 mmol/L (ref 22–32)
CREATININE: 0.82 mg/dL (ref 0.61–1.24)
Calcium: 7.5 mg/dL — ABNORMAL LOW (ref 8.9–10.3)
GFR calc Af Amer: >60 mL/min (ref >60)
GLUCOSE: 162 mg/dL — AB (ref 65–99)
POTASSIUM: 3.1 mmol/L — AB (ref 3.5–5.1)
Phosphorus: 2.6 mg/dL (ref 2.5–4.6)
Sodium: 143 mmol/L (ref 135–145)

## 2017-03-23 LAB — RENAL FUNCTION PANEL
Albumin: 1.9 g/dL — ABNORMAL LOW (ref 3.5–5.0)
Anion gap: 6 (ref 5–15)
BUN: 7 mg/dL (ref 6–20)
CHLORIDE: 104 mmol/L (ref 101–111)
CO2: 31 mmol/L (ref 22–32)
CREATININE: 0.82 mg/dL (ref 0.61–1.24)
Calcium: 7.3 mg/dL — ABNORMAL LOW (ref 8.9–10.3)
Glucose, Bld: 132 mg/dL — ABNORMAL HIGH (ref 65–99)
Phosphorus: 2 mg/dL — ABNORMAL LOW (ref 2.5–4.6)
Potassium: 3.1 mmol/L — ABNORMAL LOW (ref 3.5–5.1)
Sodium: 141 mmol/L (ref 135–145)

## 2017-03-23 LAB — CBC
HCT: 29.8 % — ABNORMAL LOW (ref 39.0–52.0)
HEMOGLOBIN: 9.6 g/dL — AB (ref 13.0–17.0)
MCH: 30.5 pg (ref 26.0–34.0)
MCHC: 32.2 g/dL (ref 30.0–36.0)
MCV: 94.6 fL (ref 78.0–100.0)
Platelets: 142 10*3/uL — ABNORMAL LOW (ref 150–400)
RBC: 3.15 MIL/uL — AB (ref 4.22–5.81)
RDW: 14 % (ref 11.5–15.5)
WBC: 5.4 10*3/uL (ref 4.0–10.5)

## 2017-03-23 LAB — MAGNESIUM: MAGNESIUM: 1.7 mg/dL (ref 1.7–2.4)

## 2017-03-24 LAB — RENAL FUNCTION PANEL
Albumin: 2.1 g/dL — ABNORMAL LOW (ref 3.5–5.0)
Anion gap: 7 (ref 5–15)
BUN: 8 mg/dL (ref 6–20)
CO2: 33 mmol/L — AB (ref 22–32)
Calcium: 7.8 mg/dL — ABNORMAL LOW (ref 8.9–10.3)
Chloride: 102 mmol/L (ref 101–111)
Creatinine, Ser: 0.79 mg/dL (ref 0.61–1.24)
GFR calc Af Amer: >60 mL/min (ref >60)
GLUCOSE: 97 mg/dL (ref 65–99)
POTASSIUM: 4.3 mmol/L (ref 3.5–5.1)
Phosphorus: 1.9 mg/dL — ABNORMAL LOW (ref 2.5–4.6)
Sodium: 142 mmol/L (ref 135–145)

## 2017-03-24 LAB — MAGNESIUM: MAGNESIUM: 1.9 mg/dL (ref 1.7–2.4)

## 2017-03-26 LAB — URINALYSIS, ROUTINE W REFLEX MICROSCOPIC
Bacteria, UA: NONE SEEN
Bilirubin Urine: NEGATIVE
Glucose, UA: NEGATIVE mg/dL
Ketones, ur: NEGATIVE mg/dL
LEUKOCYTES UA: NEGATIVE
NITRITE: NEGATIVE
PH: 8 (ref 5.0–8.0)
Protein, ur: NEGATIVE mg/dL
SPECIFIC GRAVITY, URINE: 1.011 (ref 1.005–1.030)
SQUAMOUS EPITHELIAL / LPF: NONE SEEN

## 2017-03-26 LAB — CBC
HEMATOCRIT: 39.1 % (ref 39.0–52.0)
HEMOGLOBIN: 12.5 g/dL — AB (ref 13.0–17.0)
MCH: 30.9 pg (ref 26.0–34.0)
MCHC: 32 g/dL (ref 30.0–36.0)
MCV: 96.5 fL (ref 78.0–100.0)
Platelets: 226 10*3/uL (ref 150–400)
RBC: 4.05 MIL/uL — ABNORMAL LOW (ref 4.22–5.81)
RDW: 14.5 % (ref 11.5–15.5)
WBC: 9.4 10*3/uL (ref 4.0–10.5)

## 2017-03-26 LAB — RENAL FUNCTION PANEL
ANION GAP: 10 (ref 5–15)
Albumin: 2.6 g/dL — ABNORMAL LOW (ref 3.5–5.0)
BUN: 12 mg/dL (ref 6–20)
CALCIUM: 8.2 mg/dL — AB (ref 8.9–10.3)
CO2: 31 mmol/L (ref 22–32)
Chloride: 97 mmol/L — ABNORMAL LOW (ref 101–111)
Creatinine, Ser: 0.79 mg/dL (ref 0.61–1.24)
GFR calc Af Amer: >60 mL/min (ref >60)
Glucose, Bld: 145 mg/dL — ABNORMAL HIGH (ref 65–99)
PHOSPHORUS: 3 mg/dL (ref 2.5–4.6)
POTASSIUM: 3.6 mmol/L (ref 3.5–5.1)
Sodium: 138 mmol/L (ref 135–145)

## 2017-03-26 LAB — MAGNESIUM: MAGNESIUM: 2 mg/dL (ref 1.7–2.4)

## 2017-03-27 LAB — URINE CULTURE
Culture: NO GROWTH
Special Requests: NORMAL

## 2017-03-28 LAB — RENAL FUNCTION PANEL
Albumin: 2.6 g/dL — ABNORMAL LOW (ref 3.5–5.0)
Anion gap: 8 (ref 5–15)
BUN: 20 mg/dL (ref 6–20)
CHLORIDE: 96 mmol/L — AB (ref 101–111)
CO2: 35 mmol/L — AB (ref 22–32)
CREATININE: 0.85 mg/dL (ref 0.61–1.24)
Calcium: 8.2 mg/dL — ABNORMAL LOW (ref 8.9–10.3)
GFR calc non Af Amer: >60 mL/min (ref >60)
Glucose, Bld: 152 mg/dL — ABNORMAL HIGH (ref 65–99)
Phosphorus: 3.3 mg/dL (ref 2.5–4.6)
Potassium: 4.5 mmol/L (ref 3.5–5.1)
Sodium: 139 mmol/L (ref 135–145)

## 2017-03-28 LAB — CBC
HCT: 38.7 % — ABNORMAL LOW (ref 39.0–52.0)
Hemoglobin: 12.1 g/dL — ABNORMAL LOW (ref 13.0–17.0)
MCH: 30.6 pg (ref 26.0–34.0)
MCHC: 31.3 g/dL (ref 30.0–36.0)
MCV: 98 fL (ref 78.0–100.0)
PLATELETS: 216 10*3/uL (ref 150–400)
RBC: 3.95 MIL/uL — ABNORMAL LOW (ref 4.22–5.81)
RDW: 14.9 % (ref 11.5–15.5)
WBC: 9.7 10*3/uL (ref 4.0–10.5)

## 2017-03-28 LAB — MAGNESIUM: Magnesium: 2.1 mg/dL (ref 1.7–2.4)

## 2017-03-31 ENCOUNTER — Emergency Department (HOSPITAL_COMMUNITY): Payer: Medicare Other

## 2017-03-31 ENCOUNTER — Other Ambulatory Visit: Payer: Self-pay

## 2017-03-31 ENCOUNTER — Encounter (HOSPITAL_COMMUNITY): Payer: Self-pay | Admitting: *Deleted

## 2017-03-31 ENCOUNTER — Inpatient Hospital Stay (HOSPITAL_COMMUNITY)
Admission: EM | Admit: 2017-03-31 | Discharge: 2017-04-29 | DRG: 871 | Disposition: E | Payer: Medicare Other | Attending: Internal Medicine | Admitting: Internal Medicine

## 2017-03-31 DIAGNOSIS — R402212 Coma scale, best verbal response, none, at arrival to emergency department: Secondary | ICD-10-CM | POA: Diagnosis present

## 2017-03-31 DIAGNOSIS — J9600 Acute respiratory failure, unspecified whether with hypoxia or hypercapnia: Secondary | ICD-10-CM | POA: Diagnosis present

## 2017-03-31 DIAGNOSIS — D72829 Elevated white blood cell count, unspecified: Secondary | ICD-10-CM

## 2017-03-31 DIAGNOSIS — G931 Anoxic brain damage, not elsewhere classified: Secondary | ICD-10-CM | POA: Diagnosis not present

## 2017-03-31 DIAGNOSIS — K219 Gastro-esophageal reflux disease without esophagitis: Secondary | ICD-10-CM | POA: Diagnosis present

## 2017-03-31 DIAGNOSIS — I69391 Dysphagia following cerebral infarction: Secondary | ICD-10-CM | POA: Diagnosis not present

## 2017-03-31 DIAGNOSIS — Z79899 Other long term (current) drug therapy: Secondary | ICD-10-CM | POA: Diagnosis not present

## 2017-03-31 DIAGNOSIS — Z934 Other artificial openings of gastrointestinal tract status: Secondary | ICD-10-CM

## 2017-03-31 DIAGNOSIS — A4102 Sepsis due to Methicillin resistant Staphylococcus aureus: Principal | ICD-10-CM | POA: Diagnosis present

## 2017-03-31 DIAGNOSIS — D649 Anemia, unspecified: Secondary | ICD-10-CM | POA: Diagnosis present

## 2017-03-31 DIAGNOSIS — Z930 Tracheostomy status: Secondary | ICD-10-CM | POA: Diagnosis not present

## 2017-03-31 DIAGNOSIS — E46 Unspecified protein-calorie malnutrition: Secondary | ICD-10-CM | POA: Diagnosis present

## 2017-03-31 DIAGNOSIS — Z978 Presence of other specified devices: Secondary | ICD-10-CM

## 2017-03-31 DIAGNOSIS — R1310 Dysphagia, unspecified: Secondary | ICD-10-CM | POA: Diagnosis present

## 2017-03-31 DIAGNOSIS — R402342 Coma scale, best motor response, flexion withdrawal, at arrival to emergency department: Secondary | ICD-10-CM | POA: Diagnosis present

## 2017-03-31 DIAGNOSIS — J9621 Acute and chronic respiratory failure with hypoxia: Secondary | ICD-10-CM | POA: Diagnosis present

## 2017-03-31 DIAGNOSIS — L89159 Pressure ulcer of sacral region, unspecified stage: Secondary | ICD-10-CM | POA: Diagnosis present

## 2017-03-31 DIAGNOSIS — J9602 Acute respiratory failure with hypercapnia: Secondary | ICD-10-CM

## 2017-03-31 DIAGNOSIS — J9622 Acute and chronic respiratory failure with hypercapnia: Secondary | ICD-10-CM | POA: Diagnosis present

## 2017-03-31 DIAGNOSIS — Z515 Encounter for palliative care: Secondary | ICD-10-CM | POA: Diagnosis not present

## 2017-03-31 DIAGNOSIS — R402112 Coma scale, eyes open, never, at arrival to emergency department: Secondary | ICD-10-CM | POA: Diagnosis present

## 2017-03-31 DIAGNOSIS — Z7189 Other specified counseling: Secondary | ICD-10-CM | POA: Diagnosis not present

## 2017-03-31 DIAGNOSIS — I69393 Ataxia following cerebral infarction: Secondary | ICD-10-CM

## 2017-03-31 DIAGNOSIS — R34 Anuria and oliguria: Secondary | ICD-10-CM | POA: Diagnosis present

## 2017-03-31 DIAGNOSIS — I10 Essential (primary) hypertension: Secondary | ICD-10-CM | POA: Diagnosis present

## 2017-03-31 DIAGNOSIS — E1151 Type 2 diabetes mellitus with diabetic peripheral angiopathy without gangrene: Secondary | ICD-10-CM | POA: Diagnosis present

## 2017-03-31 DIAGNOSIS — J939 Pneumothorax, unspecified: Secondary | ICD-10-CM | POA: Diagnosis present

## 2017-03-31 DIAGNOSIS — Z66 Do not resuscitate: Secondary | ICD-10-CM

## 2017-03-31 DIAGNOSIS — E874 Mixed disorder of acid-base balance: Secondary | ICD-10-CM | POA: Diagnosis present

## 2017-03-31 DIAGNOSIS — I959 Hypotension, unspecified: Secondary | ICD-10-CM

## 2017-03-31 DIAGNOSIS — L8990 Pressure ulcer of unspecified site, unspecified stage: Secondary | ICD-10-CM

## 2017-03-31 DIAGNOSIS — Z794 Long term (current) use of insulin: Secondary | ICD-10-CM | POA: Diagnosis not present

## 2017-03-31 DIAGNOSIS — N179 Acute kidney failure, unspecified: Secondary | ICD-10-CM | POA: Diagnosis present

## 2017-03-31 DIAGNOSIS — Z6822 Body mass index (BMI) 22.0-22.9, adult: Secondary | ICD-10-CM

## 2017-03-31 DIAGNOSIS — I469 Cardiac arrest, cause unspecified: Secondary | ICD-10-CM | POA: Diagnosis present

## 2017-03-31 HISTORY — DX: Gastro-esophageal reflux disease without esophagitis: K21.9

## 2017-03-31 HISTORY — DX: Cerebral infarction, unspecified: I63.9

## 2017-03-31 HISTORY — DX: Type 2 diabetes mellitus without complications: E11.9

## 2017-03-31 HISTORY — DX: Respiratory failure, unspecified, unspecified whether with hypoxia or hypercapnia: J96.90

## 2017-03-31 LAB — GLUCOSE, CAPILLARY
GLUCOSE-CAPILLARY: 105 mg/dL — AB (ref 65–99)
Glucose-Capillary: 87 mg/dL (ref 65–99)

## 2017-03-31 LAB — COMPREHENSIVE METABOLIC PANEL
ALK PHOS: 83 U/L (ref 38–126)
ALT: 41 U/L (ref 17–63)
ANION GAP: 15 (ref 5–15)
AST: 56 U/L — ABNORMAL HIGH (ref 15–41)
Albumin: 3.3 g/dL — ABNORMAL LOW (ref 3.5–5.0)
BUN: 21 mg/dL — ABNORMAL HIGH (ref 6–20)
CALCIUM: 8.5 mg/dL — AB (ref 8.9–10.3)
CO2: 27 mmol/L (ref 22–32)
CREATININE: 1.03 mg/dL (ref 0.61–1.24)
Chloride: 97 mmol/L — ABNORMAL LOW (ref 101–111)
GFR calc non Af Amer: >60 mL/min (ref >60)
Glucose, Bld: 223 mg/dL — ABNORMAL HIGH (ref 65–99)
Potassium: 4.5 mmol/L (ref 3.5–5.1)
Sodium: 139 mmol/L (ref 135–145)
Total Bilirubin: 0.6 mg/dL (ref 0.3–1.2)
Total Protein: 7.8 g/dL (ref 6.5–8.1)

## 2017-03-31 LAB — CBG MONITORING, ED: Glucose-Capillary: 109 mg/dL — ABNORMAL HIGH (ref 65–99)

## 2017-03-31 LAB — CBC WITH DIFFERENTIAL/PLATELET
Basophils Absolute: 0 10*3/uL (ref 0.0–0.1)
Basophils Relative: 0 %
Eosinophils Absolute: 0 10*3/uL (ref 0.0–0.7)
Eosinophils Relative: 0 %
HCT: 42.2 % (ref 39.0–52.0)
HEMOGLOBIN: 13.6 g/dL (ref 13.0–17.0)
LYMPHS ABS: 1.5 10*3/uL (ref 0.7–4.0)
LYMPHS PCT: 6 %
MCH: 31.9 pg (ref 26.0–34.0)
MCHC: 32.2 g/dL (ref 30.0–36.0)
MCV: 98.8 fL (ref 78.0–100.0)
MONOS PCT: 6 %
Monocytes Absolute: 1.5 10*3/uL — ABNORMAL HIGH (ref 0.1–1.0)
NEUTROS ABS: 21.4 10*3/uL — AB (ref 1.7–7.7)
NEUTROS PCT: 88 %
Platelets: 258 10*3/uL (ref 150–400)
RBC: 4.27 MIL/uL (ref 4.22–5.81)
RDW: 15 % (ref 11.5–15.5)
WBC: 24.4 10*3/uL — ABNORMAL HIGH (ref 4.0–10.5)

## 2017-03-31 LAB — LACTIC ACID, PLASMA
LACTIC ACID, VENOUS: 3.1 mmol/L — AB (ref 0.5–1.9)
Lactic Acid, Venous: 5.4 mmol/L (ref 0.5–1.9)

## 2017-03-31 LAB — CREATININE, SERUM
CREATININE: 1.09 mg/dL (ref 0.61–1.24)
GFR, EST NON AFRICAN AMERICAN: 60 mL/min — AB (ref >60)

## 2017-03-31 LAB — URINALYSIS, ROUTINE W REFLEX MICROSCOPIC
Bilirubin Urine: NEGATIVE
Glucose, UA: 50 mg/dL — AB
HGB URINE DIPSTICK: NEGATIVE
Ketones, ur: NEGATIVE mg/dL
LEUKOCYTES UA: NEGATIVE
Nitrite: NEGATIVE
PROTEIN: 30 mg/dL — AB
Specific Gravity, Urine: 1.018 (ref 1.005–1.030)
pH: 5 (ref 5.0–8.0)

## 2017-03-31 LAB — I-STAT ARTERIAL BLOOD GAS, ED
ACID-BASE DEFICIT: 1 mmol/L (ref 0.0–2.0)
BICARBONATE: 28.1 mmol/L — AB (ref 20.0–28.0)
O2 Saturation: 100 %
PCO2 ART: 68.5 mmHg — AB (ref 32.0–48.0)
PO2 ART: 405 mmHg — AB (ref 83.0–108.0)
Patient temperature: 96.8
TCO2: 30 mmol/L (ref 22–32)
pH, Arterial: 7.215 — ABNORMAL LOW (ref 7.350–7.450)

## 2017-03-31 LAB — I-STAT CG4 LACTIC ACID, ED: LACTIC ACID, VENOUS: 6.96 mmol/L — AB (ref 0.5–1.9)

## 2017-03-31 LAB — PROTIME-INR
INR: 1.15
Prothrombin Time: 14.6 seconds (ref 11.4–15.2)

## 2017-03-31 LAB — TROPONIN I: Troponin I: <0.03 ng/mL (ref <0.03)

## 2017-03-31 LAB — MRSA PCR SCREENING: MRSA by PCR: NEGATIVE

## 2017-03-31 MED ORDER — FENTANYL CITRATE (PF) 100 MCG/2ML IJ SOLN
INTRAMUSCULAR | Status: AC
  Administered 2017-03-31: 100 ug
  Filled 2017-03-31: qty 2

## 2017-03-31 MED ORDER — VANCOMYCIN HCL 10 G IV SOLR
1500.0000 mg | Freq: Once | INTRAVENOUS | Status: AC
  Administered 2017-03-31: 1500 mg via INTRAVENOUS
  Filled 2017-03-31: qty 1500

## 2017-03-31 MED ORDER — PIPERACILLIN-TAZOBACTAM 3.375 G IVPB 30 MIN
3.3750 g | Freq: Once | INTRAVENOUS | Status: AC
  Administered 2017-03-31: 3.375 g via INTRAVENOUS
  Filled 2017-03-31: qty 50

## 2017-03-31 MED ORDER — ASPIRIN 300 MG RE SUPP
300.0000 mg | RECTAL | Status: AC
  Administered 2017-03-31: 300 mg via RECTAL
  Filled 2017-03-31: qty 1

## 2017-03-31 MED ORDER — INSULIN ASPART 100 UNIT/ML ~~LOC~~ SOLN: 0.0000 [IU] | SUBCUTANEOUS | Status: DC

## 2017-03-31 MED ORDER — SODIUM CHLORIDE 0.9 % IV SOLN: 250.0000 mL | INTRAVENOUS | Status: DC | PRN

## 2017-03-31 MED ORDER — CHLORHEXIDINE GLUCONATE 0.12% ORAL RINSE (MEDLINE KIT)
15.0000 mL | Freq: Two times a day (BID) | OROMUCOSAL | Status: DC
  Administered 2017-03-31 – 2017-04-01 (×2): 15 mL via OROMUCOSAL

## 2017-03-31 MED ORDER — PIPERACILLIN-TAZOBACTAM 3.375 G IVPB
3.3750 g | Freq: Three times a day (TID) | INTRAVENOUS | Status: DC
  Administered 2017-03-31 – 2017-04-01 (×2): 3.375 g via INTRAVENOUS
  Filled 2017-03-31 (×4): qty 50

## 2017-03-31 MED ORDER — PANTOPRAZOLE SODIUM 40 MG IV SOLR
40.0000 mg | INTRAVENOUS | Status: DC
  Administered 2017-04-01: 40 mg via INTRAVENOUS
  Filled 2017-03-31: qty 40

## 2017-03-31 MED ORDER — VANCOMYCIN HCL IN DEXTROSE 1-5 GM/200ML-% IV SOLN
1000.0000 mg | Freq: Two times a day (BID) | INTRAVENOUS | Status: DC
  Administered 2017-04-01: 1000 mg via INTRAVENOUS
  Filled 2017-03-31 (×3): qty 200

## 2017-03-31 MED ORDER — LABETALOL HCL 5 MG/ML IV SOLN
10.0000 mg | INTRAVENOUS | Status: DC | PRN
Start: 2017-03-31 — End: 2017-04-01

## 2017-03-31 MED ORDER — SODIUM CHLORIDE 0.9 % IV BOLUS (SEPSIS)
1000.0000 mL | Freq: Once | INTRAVENOUS | Status: AC
  Administered 2017-03-31: 1000 mL via INTRAVENOUS

## 2017-03-31 MED ORDER — SODIUM CHLORIDE 0.9 % IV BOLUS (SEPSIS)
500.0000 mL | Freq: Once | INTRAVENOUS | Status: AC
  Administered 2017-03-31: 500 mL via INTRAVENOUS

## 2017-03-31 MED ORDER — FENTANYL 2500MCG IN NS 250ML (10MCG/ML) PREMIX INFUSION
0.0000 ug/h | INTRAVENOUS | Status: DC
  Administered 2017-03-31: 25 ug/h via INTRAVENOUS
  Filled 2017-03-31: qty 250

## 2017-03-31 MED ORDER — HEPARIN SODIUM (PORCINE) 5000 UNIT/ML IJ SOLN
5000.0000 [IU] | Freq: Three times a day (TID) | INTRAMUSCULAR | Status: DC
  Administered 2017-03-31 – 2017-04-01 (×2): 5000 [IU] via SUBCUTANEOUS
  Filled 2017-03-31 (×2): qty 1

## 2017-03-31 MED ORDER — MIDAZOLAM HCL 2 MG/2ML IJ SOLN
2.0000 mg | INTRAMUSCULAR | Status: DC | PRN
  Administered 2017-03-31 (×3): 2 mg via INTRAVENOUS
  Filled 2017-03-31 (×2): qty 2

## 2017-03-31 MED ORDER — LACTATED RINGERS IV SOLN
INTRAVENOUS | Status: DC
  Administered 2017-03-31 – 2017-04-01 (×3): via INTRAVENOUS

## 2017-03-31 MED ORDER — MIDAZOLAM HCL 2 MG/2ML IJ SOLN
INTRAMUSCULAR | Status: AC
  Administered 2017-03-31: 2 mg via INTRAVENOUS
  Filled 2017-03-31: qty 2

## 2017-03-31 MED ORDER — ASPIRIN 81 MG PO CHEW: 324.0000 mg | CHEWABLE_TABLET | ORAL | Status: AC

## 2017-03-31 MED ORDER — ORAL CARE MOUTH RINSE
15.0000 mL | Freq: Four times a day (QID) | OROMUCOSAL | Status: DC
  Administered 2017-04-01 (×2): 15 mL via OROMUCOSAL

## 2017-03-31 NOTE — ED Notes (Signed)
Spoke with Doctor regarding versed and also Doctor ordering fentanyl drip.

## 2017-03-31 NOTE — ED Notes (Signed)
Lab called critical value lactic acid 5.4 notified Dr Catha GosselinIsmail at bedside.

## 2017-03-31 NOTE — Procedures (Signed)
Tracheostomy Change Note  Patient Details:   Name: Alexander BarmanDavid E Archer DOB: 1930/03/23 MRN: [id number]    Airway Documentation:     Evaluation  O2 sats: currently acceptable Complications: No apparent complications Patient did tolerate procedure well. Bilateral Breath Sounds: Rhonchi   Patient arrived with a #6 cuffless Shiley capped being bagged through mouth. Trach removed and replaced with a #6 cuffed Shiley. Color change noted on EZcap. Bilateral breath sounds. Color returning to patient.   [name], [name] 03/30/2017, 10:15 AM

## 2017-03-31 NOTE — ED Notes (Signed)
Patient heart rate dropped to 30 Dr. Patria Maneampos  At bedside upon patient arrival. Atropine 1 mg given.  [id number] heart rate 150 b/p 61/32 0939 Dr. Patria Maneampos cardiovert 150j for rate of 2:1 flutter 0940 Adenosine 6 mg IV B/P 87/61 0942 #20 iv started right A/C Adenosine 12 mg IV given. Rate decreased to 103 however went right back up to 139

## 2017-03-31 NOTE — ED Notes (Signed)
Dr. Patria Maneampos at bedside performing groin stick to obtain blood sample. RN attempt venous blood draw, along with phlebotomy.

## 2017-03-31 NOTE — Progress Notes (Addendum)
Pharmacy Antibiotic Note  Cornell BarmanDavid E Archer is a 84 y.o. male admitted on 03/14/2017 with sepsis.  Pharmacy has been consulted for vancomycin dosing.    Plan: -vancomycin 1500 mg load x 1 -vancomycin 1 gram every 12 hours -goal trough 15-20 mcg/mL -monitor clinical progression, renal function, and trough as needed  **Addendum** -additional consult for Zosyn dosing -Zosyn 3.375 grams 4 hour infusion every 8 hours  Height: 6\' 3"  (190.5 cm) IBW/kg (Calculated) : 84.5  TBW from previous admit on 12/16: 83 kg  Temp (24hrs), Avg:97.7 F (36.5 C), Min:96.8 F (36 C), Max:98.6 F (37 C)  Recent Labs  Lab 03/26/17 0759 03/28/17 0825 03/04/2017 1044 03/24/2017 1100  WBC 9.4 9.7 24.4*  --   CREATININE 0.79 0.85 1.03  --   LATICACIDVEN  --   --   --  6.96*    CrCl cannot be calculated (Unknown ideal weight.).  Estimated CrCl 3762mL/min.  Not on File  Antimicrobials this admission: 1/31 Zosyn >>  1/31 Vancomycin >>   Dose adjustments this admission: N/A  Microbiology results: 1/31 BCx: x 2 pending 1/31  UCx: pending   Thank you for allowing pharmacy to be a part of this patient's care.  Harlow AsaAmber C Dorcas Melito, PharmD 03/16/2017 12:13 PM

## 2017-03-31 NOTE — H&P (Signed)
PULMONARY / CRITICAL CARE MEDICINE   Name: Alexander Archer MRN: [id number] DOB: November 19, 1930    ADMISSION DATE:  03/30/2017 CONSULTATION DATE: March 31, 2017  REFERRING MD: ED staff  CHIEF COMPLAINT: Found down at the rehab hospital with inability to take a breath with His Trach  HISTORY OF PRESENT ILLNESS:   Patient was not able to give any history is 84 years old Caucasian male with past medical history of diabetes hypertension significant cerebellar stroke recently in December with attacks of difficulty swallowing status post trach and PEG who was at the nursing home for 1 day after [facility] yesterday he was found down by the side of the bed with a cap on his trach CPR was initiated for patient was in his usual state of health yesterday there was no fever chills rigors nausea vomiting diarrhea family think that the patient aspirated while he was in the code they could not intubate him they put him on antibiotic until he got to the ED where his trach was switched to balloon trach cuffed trach and he was put on the vent patient is unresponsive.  GCS of 5 without any sedation.  PAST MEDICAL HISTORY :  He  has a past medical history of Diabetes mellitus without complication (HCC), GERD (gastroesophageal reflux disease), Respiratory failure (HCC), and Stroke (HCC).  PAST SURGICAL HISTORY: He  has a past surgical history that includes IR Replc Duoden/Jejuno Tube Percut W/Fluoro (03/11/2017) and Tracheostomy.  No Known Allergies  No current facility-administered medications on file prior to encounter.    Current Outpatient Medications on File Prior to Encounter  Medication Sig  . acetaminophen (TYLENOL) 500 MG tablet Take 500 mg by mouth every 6 (six) hours as needed for mild pain.  . Amino Acids-Protein Hydrolys (FEEDING SUPPLEMENT, PRO-STAT SUGAR FREE 64,) LIQD Place 30 mLs into feeding tube daily.  . Artificial Saliva (BIOTENE MOISTURIZING MOUTH)  SOLN Use as directed 2 sprays in the mouth or throat 2 (two) times daily.  . Calcium Carbonate-Vit D-Min (CALCIUM/VITAMIN D/MINERALS) 600-200 MG-UNIT TABS Give 1 tablet by tube daily.  . Glycerin-Hypromellose-PEG 400 (ARTIFICIAL TEARS) 0.2-0.2-1 % SOLN Place 1 drop into both eyes 3 (three) times daily.  Marland Kitchen glycopyrrolate (ROBINUL) 1 MG tablet Place 1 mg into feeding tube 3 (three) times daily.  Marland Kitchen guaiFENesin 200 MG tablet Place 200 mg into feeding tube 3 (three) times daily.  . insulin lispro (HUMALOG KWIKPEN) 100 UNIT/ML KiwkPen Inject 0-10 Units into the skin See admin instructions. Inject per sliding scale: if 151-200=2u, 201-250=4u, 251-300=6u, 301-350=8u, 351-400=10u. Blood sugar over 400 notify MD  . Melatonin 3 MG TABS Take 6 mg by mouth at bedtime.  . Nutritional Supplements (FEEDING SUPPLEMENT, OSMOLITE 1.5 CAL,) LIQD Place 1,000 mLs into feeding tube continuous.  . ondansetron (ZOFRAN) 4 MG tablet Take 4 mg by mouth every 8 (eight) hours as needed for nausea or vomiting.  Marland Kitchen oxyCODONE (ROXICODONE) 5 MG immediate release tablet Take 2.5 mg by mouth every 6 (six) hours as needed for severe pain.  . pantoprazole sodium (PROTONIX) 40 mg/20 mL PACK Place 40 mg into feeding tube daily.  . polyethylene glycol (MIRALAX / GLYCOLAX) packet Take 17 g by mouth daily.  . predniSONE (DELTASONE) 10 MG tablet 10 mg by Per J Tube route daily with breakfast.  . scopolamine (TRANSDERM-SCOP) 1 MG/3DAYS Place 1 patch onto the skin every 3 (three) days.  Marland Kitchen senna-docusate (SENOKOT-S) 8.6-50 MG tablet Take 2 tablets by mouth  at bedtime.    FAMILY HISTORY:  His has no family status information on file.    SOCIAL HISTORY: He  has an unknown smoking status. he has never used smokeless tobacco.  REVIEW OF SYSTEMS:   Not able to give any history unable to obtain due to patient's conditions  SUBJECTIVE:  Intubated agitated  VITAL SIGNS: BP (!) 169/146   Pulse (!) 115   Temp 98.6 F (37 C) (Rectal)    Resp (!) 23   Ht 6\' 3"  (1.905 m)   SpO2 100%   HEMODYNAMICS:    VENTILATOR SETTINGS: Vent Mode: PRVC FiO2 (%):  [40 %-100 %] 40 % Set Rate:  [20 bmp-24 bmp] 24 bmp Vt Set:  [600 mL-650 mL] 650 mL PEEP:  [5 cmH20] 5 cmH20 Plateau Pressure:  [14 cmH20] 14 cmH20  INTAKE / OUTPUT: No intake/output data recorded.  PHYSICAL EXAMINATION: General: Unresponsive agitated on the vent in moderate acute distress Neuro: GCS of 5 HEENT:  atraumatic , no jaundice , dry mucous membranes tongue bite and old blood around her nose Cardiovascular:  Irregular irregular , ESM 2/6 in the aortic area  Lungs:  CTA bilateral , no wheezing or crackles  Abdomen:  Soft lax +BS , no tenderness . Musculoskeletal:  WNL , normal pulses  Skin:  No rash    LABS:  BMET Recent Labs  Lab 03/26/17 0759 03/28/17 0825 03/07/2017 1044  NA 138 139 139  K 3.6 4.5 4.5  CL 97* 96* 97*  CO2 31 35* 27  BUN 12 20 21*  CREATININE 0.79 0.85 1.03  GLUCOSE 145* 152* 223*    Electrolytes Recent Labs  Lab 03/26/17 0759 03/28/17 0825 03/03/2017 1044  CALCIUM 8.2* 8.2* 8.5*  MG 2.0 2.1  --   PHOS 3.0 3.3  --     CBC Recent Labs  Lab 03/26/17 0759 03/28/17 0825 03/30/2017 1044  WBC 9.4 9.7 24.4*  HGB 12.5* 12.1* 13.6  HCT 39.1 38.7* 42.2  PLT 226 216 258    Coag's Recent Labs  Lab 03/20/2017 1044  INR 1.15    Sepsis Markers Recent Labs  Lab 03/20/2017 1100 03/18/2017 1241  LATICACIDVEN 6.96* 5.4*    ABG Recent Labs  Lab 03/01/2017 1001  PHART 7.215*  PCO2ART 68.5*  PO2ART 405.0*    Liver Enzymes Recent Labs  Lab 03/26/17 0759 03/28/17 0825 03/05/2017 1044  AST  --   --  56*  ALT  --   --  41  ALKPHOS  --   --  83  BILITOT  --   --  0.6  ALBUMIN 2.6* 2.6* 3.3*    Cardiac Enzymes Recent Labs  Lab 03/18/2017 1044  TROPONINI <0.03    Glucose No results for input(s): GLUCAP in the last 168 hours.  Imaging Ct Head Wo Contrast  Result Date: 03/10/2017 CLINICAL DATA:  84 year old  male found down, cardiac arrest, status post 45 min of CPR. Recent right cerebellar infarct. EXAM: CT HEAD WITHOUT CONTRAST CT CERVICAL SPINE WITHOUT CONTRAST TECHNIQUE: Multidetector CT imaging of the head and cervical spine was performed following the standard protocol without intravenous contrast. Multiplanar CT image reconstructions of the cervical spine were also generated. COMPARISON:  Head CT without contrast 03/22/2017, brain MRI 03/16/2017 FINDINGS: CT HEAD FINDINGS Brain: Stable to mildly decreased hypodensity in the inferior right cerebellar hemisphere, right PICA territory. Stable involvement of the right lateral medulla. No posterior fossa mass effect. Stable gray-white matter differentiation elsewhere. No midline shift, ventriculomegaly, mass effect,  evidence of mass lesion, intracranial hemorrhage or new cortically based acute infarction. Vascular: Calcified atherosclerosis at the skull base. No suspicious intracranial vascular hyperdensity; Basilar artery density today is stable compared to 03/13/2017 Skull: Stable.  No acute osseous abnormality identified. Sinuses/Orbits: Decreased fluid in the right sphenoid sinus. Other visible paranasal sinuses and mastoids are stable and well pneumatized. Other: No acute orbit or scalp soft tissue findings. Fluid layering in the pharynx. CT CERVICAL SPINE FINDINGS Alignment: Preserved cervical lordosis. Bilateral posterior element alignment is within normal limits. Cervicothoracic junction alignment is within normal limits. Skull base and vertebrae: Visualized skull base is intact. No atlanto-occipital dissociation. No acute cervical spine fracture. Soft tissues and spinal canal: No prevertebral fluid or swelling. No visible canal hematoma. Disc levels: Widespread cervical spine degeneration. Bulky anterior endplate osteophytes in the lower cervical spine. Upper chest: Visible upper thoracic levels appear intact. Negative lung apices. Other: Tracheostomy tube in  place. Small volume fluid layering in the larynx just above the tube. Small volume of fluid in the nasopharynx. Partially retropharyngeal course of the right ICA. IMPRESSION: 1. No acute intracranial abnormality. Expected evolution of the right PICA infarct since 03/16/2017. 2.  No acute fracture in the cervical spine. 3. Tracheostomy tube in place. Small volume of fluid above the to in the larynx and pharynx. Electronically Signed   By: Odessa Fleming M.D.   On: 2017-04-09 11:35   Ct Cervical Spine Wo Contrast  Result Date: 09-Apr-2017 CLINICAL DATA:  84 year old male found down, cardiac arrest, status post 45 min of CPR. Recent right cerebellar infarct. EXAM: CT HEAD WITHOUT CONTRAST CT CERVICAL SPINE WITHOUT CONTRAST TECHNIQUE: Multidetector CT imaging of the head and cervical spine was performed following the standard protocol without intravenous contrast. Multiplanar CT image reconstructions of the cervical spine were also generated. COMPARISON:  Head CT without contrast 03/22/2017, brain MRI 03/16/2017 FINDINGS: CT HEAD FINDINGS Brain: Stable to mildly decreased hypodensity in the inferior right cerebellar hemisphere, right PICA territory. Stable involvement of the right lateral medulla. No posterior fossa mass effect. Stable gray-white matter differentiation elsewhere. No midline shift, ventriculomegaly, mass effect, evidence of mass lesion, intracranial hemorrhage or new cortically based acute infarction. Vascular: Calcified atherosclerosis at the skull base. No suspicious intracranial vascular hyperdensity; Basilar artery density today is stable compared to 03/13/2017 Skull: Stable.  No acute osseous abnormality identified. Sinuses/Orbits: Decreased fluid in the right sphenoid sinus. Other visible paranasal sinuses and mastoids are stable and well pneumatized. Other: No acute orbit or scalp soft tissue findings. Fluid layering in the pharynx. CT CERVICAL SPINE FINDINGS Alignment: Preserved cervical lordosis.  Bilateral posterior element alignment is within normal limits. Cervicothoracic junction alignment is within normal limits. Skull base and vertebrae: Visualized skull base is intact. No atlanto-occipital dissociation. No acute cervical spine fracture. Soft tissues and spinal canal: No prevertebral fluid or swelling. No visible canal hematoma. Disc levels: Widespread cervical spine degeneration. Bulky anterior endplate osteophytes in the lower cervical spine. Upper chest: Visible upper thoracic levels appear intact. Negative lung apices. Other: Tracheostomy tube in place. Small volume fluid layering in the larynx just above the tube. Small volume of fluid in the nasopharynx. Partially retropharyngeal course of the right ICA. IMPRESSION: 1. No acute intracranial abnormality. Expected evolution of the right PICA infarct since 03/16/2017. 2.  No acute fracture in the cervical spine. 3. Tracheostomy tube in place. Small volume of fluid above the to in the larynx and pharynx. Electronically Signed   By: Odessa Fleming M.D.   On:  03/13/2017 11:35   Dg Chest Portable 1 View  Result Date: 03/05/2017 CLINICAL DATA:  Respiratory failure.  Follow-up CPR. EXAM: PORTABLE CHEST 1 VIEW COMPARISON:  03/16/2017 FINDINGS: Tracheostomy remains in place. Nasogastric tube no longer present. Heart size is normal. Aortic atherosclerosis as seen previously. The lungs are clear. The vascularity is normal. No effusions. No bone abnormality seen. IMPRESSION: Tracheostomy.  No active disease evident by radiography. Electronically Signed   By: Paulina Fusi M.D.   On: 03/20/2017 10:13        ASSESSMENT / PLAN:  PULMONARY A: Ventilator dependent at this point given his CODE STATUS of the little mental status top to the vent weaning trials every day P:   Weaning trials every day continue with vent support to the patient is able to protect his airways and initiate breath he is going to be covered for aspiration  pneumonia  CARDIOVASCULAR A:  Hypertensive will put labetalol as needed P:  This point feel hypertensive most likely post code agitation will put him on labetalol as needed may be after that he will require Norvasc or beta blockers.  RENAL A:   No active issues he will be n.p.o. Slow hydration with Ringer lactate  GASTROINTESTINAL A:   Feeding tube history of cerebellar stroke with aphasia dysphagia he is dependent on feeding tube P:   PPI and aspiration precautions  HEMATOLOGIC A:   No active issues P:  Put him on aspirin for stroke  INFECTIOUS A:   Clinical evidence of aspiration for 45 minutes code P:   Aspiration coverage aspiration pneumonia coverage with Norvasc with Zosyn   NEUROLOGIC A:   Massive stroke posterior cerebellar with dysphagia and ataxia P:   RASS goal: Start the patient aspirin will follow while he is sedated we will put him on fentanyl for light sedation as the patient is very agitated.   FAMILY  - Updates: Family was updated on the CODE STATUS discussion with him to consider DNR at this point they want to keep the patient is a full code overall prognosis over the next few months will be very poor.  Critical care time 35 minutes.    Pulmonary and Critical Care Medicine [facility] Pager: [phone]  03/05/2017, 3:09 PM

## 2017-03-31 NOTE — ED Triage Notes (Signed)
Patient presents to ed via [facility] , patient was found in the floor pulseless and apneic by staff, states patients received  CPR x 45 mins per staff. Upon FD arrival patient has radial pulses rapid. Patient is being assisted with BVM , patient has a trach . Patient has a #20 g l A/C per ems.

## 2017-03-31 NOTE — ED Notes (Signed)
Doctor at bedside patient had blood from mouth possible from tongue. Airway intact.  Doctor speaking with family members at bedside.

## 2017-03-31 NOTE — ED Notes (Signed)
Attempted report 

## 2017-03-31 NOTE — ED Notes (Signed)
Condom cath applied

## 2017-03-31 NOTE — ED Notes (Signed)
Admit Doctor at bedside. Patient having more tremors and blood pressure increasing. Will order one dose blood pressure medication.

## 2017-03-31 NOTE — ED Notes (Signed)
Automatic BP registering SBP 220, manual BP 110/60.

## 2017-03-31 NOTE — ED Provider Notes (Addendum)
[facility] Note   CSN: [id number] Arrival date & time: 18-Nov-2017  [id number]     History   Chief Complaint Chief Complaint  Patient presents with  . Respiratory Distress    HPI Alexander Archer is a 84 y.o. male.  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  His hospital course was complicated by respiratory failure.  He has a trach.  He has a PEG tube.  He was recently transferred from an LTAC to rehab facility yesterday.  Family and a private aide was with him until 8 AM.  When family arrived shortly after 830 the patient was lying on the floor and CPR had been initiated by staff as he was pulseless and unresponsive.  It is unclear if he fell or how he got out of the bed.  Apparently approximately 45 minutes of CPR occurred before return of spontaneous circulation.  Patient is brought to the emergency department with his trach capped and receiving bag mask ventilations.  He is with a pulse.  Family reports no recent illness.  He has been receiving tube feeds.  No other information is available.   Past Medical History:  Diagnosis Date  . Diabetes mellitus without complication (HCC)   . GERD (gastroesophageal reflux disease)   . Respiratory failure (HCC)   . Stroke [facility](HCC)     There are no active problems to display for this patient.   Past Surgical History:  Procedure Laterality Date  . IR REPLC DUODEN/JEJUNO TUBE PERCUT W/FLUORO  03/11/2017  . TRACHEOSTOMY         Home Medications    Prior to Admission medications   Medication Sig Start Date End Date Taking? Authorizing Provider  acetaminophen (TYLENOL) 500 MG tablet Take 500 mg by mouth every 6 (six) hours as needed for mild pain.   Yes [provider]  Amino Acids-Protein Hydrolys (FEEDING SUPPLEMENT, PRO-STAT SUGAR FREE 64,) LIQD Place 30 mLs into feeding  tube daily.   Yes [provider]  Artificial Saliva (BIOTENE MOISTURIZING MOUTH) SOLN Use as directed 2 sprays in the mouth or throat 2 (two) times daily.   Yes [provider]  Calcium Carbonate-Vit D-Min (CALCIUM/VITAMIN D/MINERALS) 600-200 MG-UNIT TABS Give 1 tablet by tube daily.   Yes [provider]  Glycerin-Hypromellose-PEG 400 (ARTIFICIAL TEARS) 0.2-0.2-1 % SOLN Place 1 drop into both eyes 3 (three) times daily.   Yes [provider]  glycopyrrolate (ROBINUL) 1 MG tablet Place 1 mg into feeding tube 3 (three) times daily.   Yes [provider]  guaiFENesin 200 MG tablet Place 200 mg into feeding tube 3 (three) times daily.   Yes [provider]  insulin lispro (HUMALOG KWIKPEN) 100 UNIT/ML KiwkPen Inject 0-10 Units into the skin See admin instructions. Inject per sliding scale: if 151-200=2u, 201-250=4u, 251-300=6u, 301-350=8u, 351-400=10u. Blood sugar over 400 notify MD   Yes [provider]  Melatonin 3 MG TABS Take 6 mg by mouth at bedtime.   Yes [provider]  Nutritional Supplements (FEEDING SUPPLEMENT, OSMOLITE 1.5 CAL,) LIQD Place 1,000 mLs into feeding tube continuous.   Yes [provider]  ondansetron (ZOFRAN) 4 MG tablet Take 4 mg by mouth every 8 (eight) hours as needed for nausea or vomiting.   Yes [provider]  oxyCODONE (ROXICODONE) 5 MG immediate release tablet Take 2.5 mg by mouth every 6 (  six) hours as needed for severe pain.   Yes [provider]  pantoprazole sodium (PROTONIX) 40 mg/20 mL PACK Place 40 mg into feeding tube daily.   Yes [provider]  polyethylene glycol (MIRALAX / GLYCOLAX) packet Take 17 g by mouth daily.   Yes [provider]  predniSONE (DELTASONE) 10 MG tablet 10 mg by Per J Tube route daily with breakfast.   Yes [provider]  scopolamine (TRANSDERM-SCOP) 1 MG/3DAYS Place 1 patch onto the skin every 3 (three) days.    Yes [provider]  senna-docusate (SENOKOT-S) 8.6-50 MG tablet Take 2 tablets by mouth at bedtime.   Yes [provider]    Family History No family history on file.  Social History Social History   Tobacco Use  . Smoking status: Unknown If Ever Smoked  . Smokeless tobacco: Never Used  Substance Use Topics  . Alcohol use: Not on file  . Drug use: Not on file     Allergies   Patient has no allergy information on record.   Review of Systems Review of Systems  All other systems reviewed and are negative.    Physical Exam Updated Vital Signs BP (!) 135/119   Pulse (!) 122   Temp 98.6 F (37 C) (Rectal)   Resp (!) 23   Ht 6\' 3"  (1.905 m)   SpO2 100%   Physical Exam  Constitutional: He appears well-developed.  Chronically ill-appearing  HENT:  Head: Normocephalic.  Eyes: EOM are normal.  Neck: Neck supple.  6-0 uncuffed trach in place without surrounding signs of infection.   Cardiovascular: Normal rate and regular rhythm.  Pulmonary/Chest:  Breath sounds present with bag mask ventilation  Abdominal: He exhibits no distension.  Musculoskeletal: He exhibits no edema.  Neurological:  GCS 3   Skin: Skin is warm. No rash noted.  Nursing note and vitals reviewed.    ED Treatments / Results  Labs (all labs ordered are listed, but only abnormal results are displayed) Labs Reviewed  CBC WITH DIFFERENTIAL/PLATELET - Abnormal; Notable for the following components:      Result Value   WBC 24.4 (*)    Neutro Abs 21.4 (*)    Monocytes Absolute 1.5 (*)    All other components within normal limits  COMPREHENSIVE METABOLIC PANEL - Abnormal; Notable for the following components:   Chloride 97 (*)    Glucose, Bld 223 (*)    BUN 21 (*)    Calcium 8.5 (*)    Albumin 3.3 (*)    AST 56 (*)    All other components within normal limits  URINALYSIS, ROUTINE W REFLEX MICROSCOPIC - Abnormal; Notable for the following components:   Glucose, UA 50 (*)      Protein, ur 30 (*)    Bacteria, UA FEW (*)    Squamous Epithelial / LPF 0-5 (*)    All other components within normal limits  I-STAT ARTERIAL BLOOD GAS, ED - Abnormal; Notable for the following components:   pH, Arterial 7.215 (*)    pCO2 arterial 68.5 (*)    pO2, Arterial 405.0 (*)    Bicarbonate 28.1 (*)    All other components within normal limits  I-STAT CG4 LACTIC ACID, ED - Abnormal; Notable for the following components:   Lactic Acid, Venous 6.96 (*)    All other components within normal limits  CULTURE, BLOOD (ROUTINE X 2)  CULTURE, BLOOD (ROUTINE X 2)  URINE CULTURE  TROPONIN I  PROTIME-INR  LACTIC  ACID, PLASMA    EKG  EKG Interpretation  Date/Time:  Thursday March 31 2017 09:38:09 EST Ventricular Rate:  156 PR Interval:    QRS Duration: 144 QT Interval:  306 QTC Calculation: 493 R Axis:   99 Text Interpretation:  Confirmed by Azalia Bilis ([id number]) on 03/27/2017 12:45:22 PM       Radiology Ct Head Wo Contrast  Result Date: 03/27/2017 CLINICAL DATA:  84 year old male found down, cardiac arrest, status post 45 min of CPR. Recent right cerebellar infarct. EXAM: CT HEAD WITHOUT CONTRAST CT CERVICAL SPINE WITHOUT CONTRAST TECHNIQUE: Multidetector CT imaging of the head and cervical spine was performed following the standard protocol without intravenous contrast. Multiplanar CT image reconstructions of the cervical spine were also generated. COMPARISON:  Head CT without contrast 03/22/2017, brain MRI 03/16/2017 FINDINGS: CT HEAD FINDINGS Brain: Stable to mildly decreased hypodensity in the inferior right cerebellar hemisphere, right PICA territory. Stable involvement of the right lateral medulla. No posterior fossa mass effect. Stable gray-white matter differentiation elsewhere. No midline shift, ventriculomegaly, mass effect, evidence of mass lesion, intracranial hemorrhage or new cortically based acute infarction. Vascular: Calcified atherosclerosis at the skull base.  No suspicious intracranial vascular hyperdensity; Basilar artery density today is stable compared to 03/13/2017 Skull: Stable.  No acute osseous abnormality identified. Sinuses/Orbits: Decreased fluid in the right sphenoid sinus. Other visible paranasal sinuses and mastoids are stable and well pneumatized. Other: No acute orbit or scalp soft tissue findings. Fluid layering in the pharynx. CT CERVICAL SPINE FINDINGS Alignment: Preserved cervical lordosis. Bilateral posterior element alignment is within normal limits. Cervicothoracic junction alignment is within normal limits. Skull base and vertebrae: Visualized skull base is intact. No atlanto-occipital dissociation. No acute cervical spine fracture. Soft tissues and spinal canal: No prevertebral fluid or swelling. No visible canal hematoma. Disc levels: Widespread cervical spine degeneration. Bulky anterior endplate osteophytes in the lower cervical spine. Upper chest: Visible upper thoracic levels appear intact. Negative lung apices. Other: Tracheostomy tube in place. Small volume fluid layering in the larynx just above the tube. Small volume of fluid in the nasopharynx. Partially retropharyngeal course of the right ICA. IMPRESSION: 1. No acute intracranial abnormality. Expected evolution of the right PICA infarct since 03/16/2017. 2.  No acute fracture in the cervical spine. 3. Tracheostomy tube in place. Small volume of fluid above the to in the larynx and pharynx. Electronically Signed   By: Odessa Fleming M.D.   On: 03/04/2017 11:35   Ct Cervical Spine Wo Contrast  Result Date: 03/06/2017 CLINICAL DATA:  84 year old male found down, cardiac arrest, status post 45 min of CPR. Recent right cerebellar infarct. EXAM: CT HEAD WITHOUT CONTRAST CT CERVICAL SPINE WITHOUT CONTRAST TECHNIQUE: Multidetector CT imaging of the head and cervical spine was performed following the standard protocol without intravenous contrast. Multiplanar CT image reconstructions of the  cervical spine were also generated. COMPARISON:  Head CT without contrast 03/22/2017, brain MRI 03/16/2017 FINDINGS: CT HEAD FINDINGS Brain: Stable to mildly decreased hypodensity in the inferior right cerebellar hemisphere, right PICA territory. Stable involvement of the right lateral medulla. No posterior fossa mass effect. Stable gray-white matter differentiation elsewhere. No midline shift, ventriculomegaly, mass effect, evidence of mass lesion, intracranial hemorrhage or new cortically based acute infarction. Vascular: Calcified atherosclerosis at the skull base. No suspicious intracranial vascular hyperdensity; Basilar artery density today is stable compared to 03/13/2017 Skull: Stable.  No acute osseous abnormality identified. Sinuses/Orbits: Decreased fluid in the right sphenoid sinus. Other visible paranasal sinuses and mastoids are stable  and well pneumatized. Other: No acute orbit or scalp soft tissue findings. Fluid layering in the pharynx. CT CERVICAL SPINE FINDINGS Alignment: Preserved cervical lordosis. Bilateral posterior element alignment is within normal limits. Cervicothoracic junction alignment is within normal limits. Skull base and vertebrae: Visualized skull base is intact. No atlanto-occipital dissociation. No acute cervical spine fracture. Soft tissues and spinal canal: No prevertebral fluid or swelling. No visible canal hematoma. Disc levels: Widespread cervical spine degeneration. Bulky anterior endplate osteophytes in the lower cervical spine. Upper chest: Visible upper thoracic levels appear intact. Negative lung apices. Other: Tracheostomy tube in place. Small volume fluid layering in the larynx just above the tube. Small volume of fluid in the nasopharynx. Partially retropharyngeal course of the right ICA. IMPRESSION: 1. No acute intracranial abnormality. Expected evolution of the right PICA infarct since 03/16/2017. 2.  No acute fracture in the cervical spine. 3. Tracheostomy tube in  place. Small volume of fluid above the to in the larynx and pharynx. Electronically Signed   By: Odessa Fleming M.D.   On: 03/14/2017 11:35   Dg Chest Portable 1 View  Result Date: 03/07/2017 CLINICAL DATA:  Respiratory failure.  Follow-up CPR. EXAM: PORTABLE CHEST 1 VIEW COMPARISON:  03/16/2017 FINDINGS: Tracheostomy remains in place. Nasogastric tube no longer present. Heart size is normal. Aortic atherosclerosis as seen previously. The lungs are clear. The vascularity is normal. No effusions. No bone abnormality seen. IMPRESSION: Tracheostomy.  No active disease evident by radiography. Electronically Signed   By: Paulina Fusi M.D.   On: 03/25/2017 10:13    Procedures .Critical Care Performed by: Azalia Bilis, MD Authorized by: Azalia Bilis, MD    TRACHEOSTOMY REPLACEMENT Performed by: Azalia Bilis, MD Authorized by: Azalia Bilis, MD  Consent: The procedure was performed in an emergent situation. Time out: Immediately prior to procedure a "time out" was called to verify the correct patient, procedure, equipment, support staff and site/side marked as required. Tracheostomy replacement indications: respiratory failure, need for cuffed trach. Preparation: Patient was prepped and draped in the usual sterile fashion. Tube cuff: single cuff Tube size: 6.0 mm Cuff inflation: inflated Cuff type: air Patient tolerance: Patient tolerated the procedure well with no immediate complications    CRITICAL CARE Performed by: Azalia Bilis Total critical care time: 35 minutes Critical care time was exclusive of separately billable procedures and treating other patients. Critical care was necessary to treat or prevent imminent or life-threatening deterioration. Critical care was time spent personally by me on the following activities: development of treatment plan with patient and/or surrogate as well as nursing, discussions with consultants, evaluation of patient's response to treatment, examination of  patient, obtaining history from patient or surrogate, ordering and performing treatments and interventions, ordering and review of laboratory studies, ordering and review of radiographic studies, pulse oximetry and re-evaluation of patient's condition.   Medications Ordered in ED Medications  midazolam (VERSED) injection 2 mg (2 mg Intravenous Given 03/29/2017 1222)  piperacillin-tazobactam (ZOSYN) IVPB 3.375 g (3.375 g Intravenous New Bag/Given 03/20/2017 1202)  vancomycin (VANCOCIN) 1,500 mg in sodium chloride 0.9 % 500 mL IVPB (not administered)  vancomycin (VANCOCIN) IVPB 1000 mg/200 mL premix (not administered)  sodium chloride 0.9 % bolus 1,000 mL (0 mLs Intravenous Stopped 03/08/2017 1024)  fentaNYL (SUBLIMAZE) 100 MCG/2ML injection (100 mcg  Given 03/22/2017 1010)  sodium chloride 0.9 % bolus 1,000 mL (0 mLs Intravenous Stopped 03/19/2017 1201)  sodium chloride 0.9 % bolus 1,000 mL (1,000 mLs Intravenous New Bag/Given 03/18/2017 1202)  Initial Impression / Assessment and Plan / ED Course  I have reviewed the triage vital signs and the nursing notes.  Pertinent labs & imaging results that were available during my care of the patient were reviewed by me and considered in my medical decision making (see chart for details).     Patient was admitted the hospital for acute on chronic respiratory failure likely hypercarbic respiratory failure.  Improved mental status on the ventilator.  CT imaging acute abnormality.  No hemorrhagic conversion of his prior stroke.  White blood cell count of 24,000 noted.  Blood cultures obtained.  Patient started on vancomycin and Zosyn.  30 cc/kg bolus given.  Family updated.  Lactate will need to be rechecked as the initial lactate was 6.9.  Chest x-ray shows no evidence of infiltrate.  Admission to the intensive care unit.  Case discussed with intensivist.  Family updated.  Past medical history reviewed including his last recent hospitalization    Final Clinical  Impressions(s) / ED Diagnoses   Final diagnoses:  Acute on chronic respiratory failure with hypercapnia (HCC)  Cardiac arrest (HCC)  Hypotension, unspecified hypotension type  Leukocytosis, unspecified type    ED Discharge Orders    None       Azalia Bilis, MD 03/06/2017 1251    Azalia Bilis, MD 03/24/2017 1419

## 2017-04-01 ENCOUNTER — Inpatient Hospital Stay (HOSPITAL_COMMUNITY): Payer: Medicare Other

## 2017-04-01 DIAGNOSIS — L8990 Pressure ulcer of unspecified site, unspecified stage: Secondary | ICD-10-CM

## 2017-04-01 DIAGNOSIS — G931 Anoxic brain damage, not elsewhere classified: Secondary | ICD-10-CM

## 2017-04-01 DIAGNOSIS — Z7189 Other specified counseling: Secondary | ICD-10-CM

## 2017-04-01 DIAGNOSIS — Z515 Encounter for palliative care: Secondary | ICD-10-CM

## 2017-04-01 LAB — CBC WITH DIFFERENTIAL/PLATELET
Basophils Absolute: 0 10*3/uL (ref 0.0–0.1)
Basophils Relative: 0 %
EOS PCT: 0 %
Eosinophils Absolute: 0 10*3/uL (ref 0.0–0.7)
HCT: 29.6 % — ABNORMAL LOW (ref 39.0–52.0)
Hemoglobin: 9.5 g/dL — ABNORMAL LOW (ref 13.0–17.0)
LYMPHS ABS: 2.9 10*3/uL (ref 0.7–4.0)
LYMPHS PCT: 29 %
MCH: 30.5 pg (ref 26.0–34.0)
MCHC: 32.1 g/dL (ref 30.0–36.0)
MCV: 95.2 fL (ref 78.0–100.0)
MONOS PCT: 10 %
Monocytes Absolute: 1 10*3/uL (ref 0.1–1.0)
Neutro Abs: 6 10*3/uL (ref 1.7–7.7)
Neutrophils Relative %: 61 %
PLATELETS: 177 10*3/uL (ref 150–400)
RBC: 3.11 MIL/uL — ABNORMAL LOW (ref 4.22–5.81)
RDW: 15.4 % (ref 11.5–15.5)
WBC: 10 10*3/uL (ref 4.0–10.5)

## 2017-04-01 LAB — BLOOD CULTURE ID PANEL (REFLEXED)
Acinetobacter baumannii: NOT DETECTED
CANDIDA ALBICANS: NOT DETECTED
CANDIDA GLABRATA: NOT DETECTED
CANDIDA KRUSEI: NOT DETECTED
CANDIDA PARAPSILOSIS: NOT DETECTED
CANDIDA TROPICALIS: NOT DETECTED
ENTEROBACTER CLOACAE COMPLEX: NOT DETECTED
Enterobacteriaceae species: NOT DETECTED
Enterococcus species: DETECTED — AB
Escherichia coli: NOT DETECTED
Haemophilus influenzae: NOT DETECTED
KLEBSIELLA OXYTOCA: NOT DETECTED
KLEBSIELLA PNEUMONIAE: NOT DETECTED
LISTERIA MONOCYTOGENES: NOT DETECTED
Methicillin resistance: DETECTED — AB
Neisseria meningitidis: NOT DETECTED
PROTEUS SPECIES: NOT DETECTED
Pseudomonas aeruginosa: NOT DETECTED
STREPTOCOCCUS PNEUMONIAE: NOT DETECTED
Serratia marcescens: NOT DETECTED
Staphylococcus aureus (BCID): NOT DETECTED
Staphylococcus species: DETECTED — AB
Streptococcus agalactiae: NOT DETECTED
Streptococcus pyogenes: NOT DETECTED
Streptococcus species: NOT DETECTED
Vancomycin resistance: NOT DETECTED

## 2017-04-01 LAB — URINE CULTURE: Culture: NO GROWTH

## 2017-04-01 LAB — COMPREHENSIVE METABOLIC PANEL
ALBUMIN: 2.5 g/dL — AB (ref 3.5–5.0)
ALT: 44 U/L (ref 17–63)
AST: 115 U/L — AB (ref 15–41)
Alkaline Phosphatase: 55 U/L (ref 38–126)
Anion gap: 9 (ref 5–15)
BUN: 29 mg/dL — AB (ref 6–20)
CHLORIDE: 108 mmol/L (ref 101–111)
CO2: 24 mmol/L (ref 22–32)
CREATININE: 1.43 mg/dL — AB (ref 0.61–1.24)
Calcium: 7.9 mg/dL — ABNORMAL LOW (ref 8.9–10.3)
GFR calc Af Amer: 50 mL/min — ABNORMAL LOW (ref >60)
GFR, EST NON AFRICAN AMERICAN: 43 mL/min — AB (ref >60)
GLUCOSE: 126 mg/dL — AB (ref 65–99)
POTASSIUM: 4.4 mmol/L (ref 3.5–5.1)
SODIUM: 141 mmol/L (ref 135–145)
Total Bilirubin: 1.2 mg/dL (ref 0.3–1.2)
Total Protein: 5.7 g/dL — ABNORMAL LOW (ref 6.5–8.1)

## 2017-04-01 LAB — BLOOD GAS, ARTERIAL
Acid-Base Excess: 3.1 mmol/L — ABNORMAL HIGH (ref 0.0–2.0)
BICARBONATE: 24.9 mmol/L (ref 20.0–28.0)
Drawn by: 252031
FIO2: 40
LHR: 24 {breaths}/min
MECHVT: 650 mL
O2 Saturation: 98.9 %
PEEP: 5 cmH2O
Patient temperature: 98.6
pCO2 arterial: 24.4 mmHg — ABNORMAL LOW (ref 32.0–48.0)
pH, Arterial: 7.613 (ref 7.350–7.450)
pO2, Arterial: 103 mmHg (ref 83.0–108.0)

## 2017-04-01 LAB — PROTIME-INR
INR: 1.39
PROTHROMBIN TIME: 16.9 s — AB (ref 11.4–15.2)

## 2017-04-01 LAB — MAGNESIUM: Magnesium: 1.7 mg/dL (ref 1.7–2.4)

## 2017-04-01 LAB — GLUCOSE, CAPILLARY
GLUCOSE-CAPILLARY: 105 mg/dL — AB (ref 65–99)
GLUCOSE-CAPILLARY: 106 mg/dL — AB (ref 65–99)

## 2017-04-01 LAB — APTT: aPTT: 33 seconds (ref 24–36)

## 2017-04-01 LAB — LACTIC ACID, PLASMA: LACTIC ACID, VENOUS: 2.1 mmol/L — AB (ref 0.5–1.9)

## 2017-04-01 MED ORDER — LORAZEPAM BOLUS VIA INFUSION
2.0000 mg | INTRAVENOUS | Status: DC | PRN
  Filled 2017-04-01: qty 5

## 2017-04-01 MED ORDER — MORPHINE SULFATE 25 MG/ML IV SOLN
1.0000 mg/h | INTRAVENOUS | Status: DC
  Administered 2017-04-01: 1 mg/h via INTRAVENOUS
  Filled 2017-04-01: qty 10

## 2017-04-01 MED ORDER — LORAZEPAM 2 MG/ML IJ SOLN
2.0000 mg | INTRAMUSCULAR | Status: DC | PRN
  Filled 2017-04-01: qty 1

## 2017-04-01 MED ORDER — MORPHINE BOLUS VIA INFUSION
5.0000 mg | INTRAVENOUS | Status: DC | PRN
  Administered 2017-04-01: 10 mg via INTRAVENOUS
  Administered 2017-04-01: 12.5 mg via INTRAVENOUS
  Filled 2017-04-01: qty 20

## 2017-04-01 MED ORDER — SODIUM CHLORIDE 0.9 % IV SOLN
3.0000 g | Freq: Four times a day (QID) | INTRAVENOUS | Status: DC
  Filled 2017-04-01 (×2): qty 3

## 2017-04-01 MED ORDER — MORPHINE SULFATE 25 MG/ML IV SOLN
10.0000 mg/h | INTRAVENOUS | Status: DC
  Filled 2017-04-01: qty 10

## 2017-04-01 MED ORDER — LORAZEPAM 2 MG/ML IJ SOLN: 5.0000 mg/h | INTRAVENOUS | Status: DC

## 2017-04-01 DEATH — deceased

## 2017-04-02 MED FILL — Medication: Qty: 1 | Status: AC

## 2017-04-03 LAB — CULTURE, BLOOD (ROUTINE X 2)

## 2017-04-05 LAB — CULTURE, BLOOD (ROUTINE X 2)
Culture: NO GROWTH
Special Requests: ADEQUATE

## 2017-04-11 ENCOUNTER — Telehealth: Payer: Self-pay

## 2017-04-11 NOTE — Telephone Encounter (Signed)
[facility]17 [facility] The d/c is for burial. The patient is a patient of Doctor Molli KnockYacoub. [facility].  On 04/13/17 I received the d/c back from Doctor Molli KnockYacoub. I got the d/c ready and called the funeral home to let them know I mailed the d/c to vital records per the funeral home request.

## 2017-04-14 ENCOUNTER — Ambulatory Visit (HOSPITAL_COMMUNITY): Payer: Self-pay

## 2017-04-29 NOTE — Progress Notes (Signed)
PULMONARY / CRITICAL CARE MEDICINE   Name: Alexander Archer MRN: [id number] DOB: 11/12/1930    ADMISSION DATE:  2017/04/13 CONSULTATION DATE: 13-Apr-2017  REFERRING MD: ED staff  CHIEF COMPLAINT: Found down at the rehab hospital with inability to take a breath with His Trach  HISTORY OF PRESENT ILLNESS:   Patient was not able to give any history is 84 years old Caucasian male with past medical history of diabetes hypertension significant cerebellar stroke recently in December with attacks of difficulty swallowing status post trach and PEG who was at the nursing home for 1 day after [facility] yesterday he was found down by the side of the bed with a cap on his trach CPR was initiated for patient was in his usual state of health yesterday there was no fever chills rigors nausea vomiting diarrhea family think that the patient aspirated while he was in the code they could not intubate him they put him on antibiotic until he got to the ED where his trach was switched to balloon trach cuffed trach and he was put on the vent patient is unresponsive.  GCS of 5 without any sedation.  SUBJECTIVE:  Agitated overnight, remains on fentanyl 75 mcg/hr New 25-30% left pneumothorax on am CXR Hemodynamically stable BC + MRSA bacteremia   VITAL SIGNS: BP 96/66 (BP Location: Left Arm)   Pulse (!) 112   Temp 98.9 F (37.2 C) (Oral)   Resp (!) 28   Ht 6' (1.829 m)   Wt 162 lb 11.2 oz (73.8 kg)   SpO2 100%   BMI 22.07 kg/m   HEMODYNAMICS:    VENTILATOR SETTINGS: Vent Mode: PRVC FiO2 (%):  [40 %-100 %] 100 % Set Rate:  [16 bmp-24 bmp] 16 bmp Vt Set:  [600 mL-650 mL] 650 mL PEEP:  [5 cmH20] 5 cmH20 Plateau Pressure:  [14 cmH20-17 cmH20] 16 cmH20  INTAKE / OUTPUT: I/O last 3 completed shifts: In: 5787.4 [I.V.:1987.4; IV Piggyback:3800] Out: 450 [Urine:450]  PHYSICAL EXAMINATION: General:  Critically ill elderly male lying in bed on MV  intermittently agitated HEENT: MM pink/dry, midline shiley trach, site C/D/I, eye's matted Neuro: opens eyes to verbal, not f/c, moves upper extremities frequently > lower extremities CV: ST 107 PULM: even/non-labored on MV, lungs bilaterally clear, diminished on left base GI: soft, BS +, peg tube clamped  Extremities: warm/dry, trace BL edema  Skin: no rashes   LABS:  BMET Recent Labs  Lab 03/28/17 0825 04/13/2017 1044 2017-04-13 1545 04/23/2017 0227  NA 139 139  --  141  K 4.5 4.5  --  4.4  CL 96* 97*  --  108  CO2 35* 27  --  24  BUN 20 21*  --  29*  CREATININE 0.85 1.03 1.09 1.43*  GLUCOSE 152* 223*  --  126*    Electrolytes Recent Labs  Lab 03/26/17 0759 03/28/17 0825 04/13/2017 1044 04/05/2017 0227  CALCIUM 8.2* 8.2* 8.5* 7.9*  MG 2.0 2.1  --  1.7  PHOS 3.0 3.3  --   --     CBC Recent Labs  Lab 03/28/17 0825 April 13, 2017 1044 04/22/2017 0227  WBC 9.7 24.4* 10.0  HGB 12.1* 13.6 9.5*  HCT 38.7* 42.2 29.6*  PLT 216 258 177    Coag's Recent Labs  Lab 04/13/17 1044 04/13/2017 0227  APTT  --  33  INR 1.15 1.39    Sepsis Markers Recent Labs  Lab 04/13/17 1241 04/13/17 2252 04/10/2017  0227  LATICACIDVEN 5.4* 3.1* 2.1*    ABG Recent Labs  Lab 04/25/17 1001 04/25/2017 0355  PHART 7.215* 7.613*  PCO2ART 68.5* 24.4*  PO2ART 405.0* 103    Liver Enzymes Recent Labs  Lab 03/28/17 0825 2017-04-25 1044 04/18/2017 0227  AST  --  56* 115*  ALT  --  41 44  ALKPHOS  --  83 55  BILITOT  --  0.6 1.2  ALBUMIN 2.6* 3.3* 2.5*    Cardiac Enzymes Recent Labs  Lab 04-25-17 1044  TROPONINI <0.03    Glucose Recent Labs  Lab 04/25/17 1543 04-25-17 2029 25-Apr-2017 2342 04/17/2017 0400 04/14/2017 0809  GLUCAP 109* 87 105* 105* 106*    Imaging Ct Head Wo Contrast  Result Date: 2017-04-25 CLINICAL DATA:  84 year old male found down, cardiac arrest, status post 45 min of CPR. Recent right cerebellar infarct. EXAM: CT HEAD WITHOUT CONTRAST CT CERVICAL SPINE WITHOUT  CONTRAST TECHNIQUE: Multidetector CT imaging of the head and cervical spine was performed following the standard protocol without intravenous contrast. Multiplanar CT image reconstructions of the cervical spine were also generated. COMPARISON:  Head CT without contrast 03/22/2017, brain MRI 03/16/2017 FINDINGS: CT HEAD FINDINGS Brain: Stable to mildly decreased hypodensity in the inferior right cerebellar hemisphere, right PICA territory. Stable involvement of the right lateral medulla. No posterior fossa mass effect. Stable gray-white matter differentiation elsewhere. No midline shift, ventriculomegaly, mass effect, evidence of mass lesion, intracranial hemorrhage or new cortically based acute infarction. Vascular: Calcified atherosclerosis at the skull base. No suspicious intracranial vascular hyperdensity; Basilar artery density today is stable compared to 03/13/2017 Skull: Stable.  No acute osseous abnormality identified. Sinuses/Orbits: Decreased fluid in the right sphenoid sinus. Other visible paranasal sinuses and mastoids are stable and well pneumatized. Other: No acute orbit or scalp soft tissue findings. Fluid layering in the pharynx. CT CERVICAL SPINE FINDINGS Alignment: Preserved cervical lordosis. Bilateral posterior element alignment is within normal limits. Cervicothoracic junction alignment is within normal limits. Skull base and vertebrae: Visualized skull base is intact. No atlanto-occipital dissociation. No acute cervical spine fracture. Soft tissues and spinal canal: No prevertebral fluid or swelling. No visible canal hematoma. Disc levels: Widespread cervical spine degeneration. Bulky anterior endplate osteophytes in the lower cervical spine. Upper chest: Visible upper thoracic levels appear intact. Negative lung apices. Other: Tracheostomy tube in place. Small volume fluid layering in the larynx just above the tube. Small volume of fluid in the nasopharynx. Partially retropharyngeal course of  the right ICA. IMPRESSION: 1. No acute intracranial abnormality. Expected evolution of the right PICA infarct since 03/16/2017. 2.  No acute fracture in the cervical spine. 3. Tracheostomy tube in place. Small volume of fluid above the to in the larynx and pharynx. Electronically Signed   By: Odessa Fleming M.D.   On: Apr 25, 2017 11:35   Ct Cervical Spine Wo Contrast  Result Date: 04/25/2017 CLINICAL DATA:  84 year old male found down, cardiac arrest, status post 45 min of CPR. Recent right cerebellar infarct. EXAM: CT HEAD WITHOUT CONTRAST CT CERVICAL SPINE WITHOUT CONTRAST TECHNIQUE: Multidetector CT imaging of the head and cervical spine was performed following the standard protocol without intravenous contrast. Multiplanar CT image reconstructions of the cervical spine were also generated. COMPARISON:  Head CT without contrast 03/22/2017, brain MRI 03/16/2017 FINDINGS: CT HEAD FINDINGS Brain: Stable to mildly decreased hypodensity in the inferior right cerebellar hemisphere, right PICA territory. Stable involvement of the right lateral medulla. No posterior fossa mass effect. Stable gray-white matter differentiation elsewhere. No midline shift,  ventriculomegaly, mass effect, evidence of mass lesion, intracranial hemorrhage or new cortically based acute infarction. Vascular: Calcified atherosclerosis at the skull base. No suspicious intracranial vascular hyperdensity; Basilar artery density today is stable compared to 03/13/2017 Skull: Stable.  No acute osseous abnormality identified. Sinuses/Orbits: Decreased fluid in the right sphenoid sinus. Other visible paranasal sinuses and mastoids are stable and well pneumatized. Other: No acute orbit or scalp soft tissue findings. Fluid layering in the pharynx. CT CERVICAL SPINE FINDINGS Alignment: Preserved cervical lordosis. Bilateral posterior element alignment is within normal limits. Cervicothoracic junction alignment is within normal limits. Skull base and vertebrae:  Visualized skull base is intact. No atlanto-occipital dissociation. No acute cervical spine fracture. Soft tissues and spinal canal: No prevertebral fluid or swelling. No visible canal hematoma. Disc levels: Widespread cervical spine degeneration. Bulky anterior endplate osteophytes in the lower cervical spine. Upper chest: Visible upper thoracic levels appear intact. Negative lung apices. Other: Tracheostomy tube in place. Small volume fluid layering in the larynx just above the tube. Small volume of fluid in the nasopharynx. Partially retropharyngeal course of the right ICA. IMPRESSION: 1. No acute intracranial abnormality. Expected evolution of the right PICA infarct since 03/16/2017. 2.  No acute fracture in the cervical spine. 3. Tracheostomy tube in place. Small volume of fluid above the to in the larynx and pharynx. Electronically Signed   By: Odessa FlemingH  Hall M.D.   On: 18-Sep-2017 11:35   Dg Chest Port 1 View  Result Date: 04/05/2017 CLINICAL DATA:  Followup ventilator support. EXAM: PORTABLE CHEST 1 VIEW COMPARISON:  18-Sep-2017 FINDINGS: Tracheostomy in place. New left pneumothorax, 25-30% in volume. No sign of tension. Right chest remains clear. IMPRESSION: New left pneumothorax, 25-30% without evidence of tension. I am in the process of calling this report. Electronically Signed   By: Paulina FusiMark  Shogry M.D.   On: 04/12/2017 07:47   Dg Chest Portable 1 View  Result Date: 25-Jun-2017 CLINICAL DATA:  Respiratory failure.  Follow-up CPR. EXAM: PORTABLE CHEST 1 VIEW COMPARISON:  03/16/2017 FINDINGS: Tracheostomy remains in place. Nasogastric tube no longer present. Heart size is normal. Aortic atherosclerosis as seen previously. The lungs are clear. The vascularity is normal. No effusions. No bone abnormality seen. IMPRESSION: Tracheostomy.  No active disease evident by radiography. Electronically Signed   By: Paulina FusiMark  Shogry M.D.   On: 18-Sep-2017 10:13   Cultures:  1/31 BC >> MRSA  1/31 UC >> no growth    ASSESSMENT / PLAN:  PULMONARY A: Acute on chronic hypoxic respiratory failure now VDRF Hx tracheostomy  R/o aspiration pneumonia Left pneumothorax  P:   Full MV support- PRVC Decrease RR 16 with acute resp alkalosis  NO SBT Increase FIO2 to 1.0 for nitrogen washout Holding on CT for now, while family decides on possible transition to comfort care   CARDIOVASCULAR A:  Cardiac Arrest - 45 mins after capping of trach Sepsis  Hx HTN, CVA - neg troponin  P:  Tele monitor Prn labetalol  Lactate resolving  Continue ASA for CVA Hold further testing given MRSA bacteremia given likely transition to comfort care  RENAL A:   AKI with oliguria  Lactate acidosis  P:  LR at 125 ml/hr Trend BMP / urinary output Replace electrolytes as indicated Avoid nephrotoxic agents, ensure adequate renal perfusion  GASTROINTESTINAL A:   Dysphagia s/p Gtube Protein malnutrition P:   NPO  PPI for SUP Aspiration precautions Start TF if no transition to comfort care  PPI and aspiration precautions  HEMATOLOGIC A:  Anemia - mild drop in h/h, no signs of bleeding P:  Trend CBC Transfuse for Hgb < 7 SCD for VTE  INFECTIOUS A:   R/o aspiration PNA MRSA bacteremia  - CXR 2/1 without obvious inflitrate P:   Continue zosyn and vanc Consider PCT in am to limit abx  Trend WBC/ fever curve  NEUROLOGIC A:   Massive stroke posterior cerebellar with dysphagia and ataxia agitation  P:   RASS goal: -1 PAD protocol with fentanyl gtt/ prn Versed Ongoing neuro exams Switch to morphine gtt if transition to comfort care.   FAMILY  - Updates: Patient's son at bedside this am.  Updated him concerning new pneumothorax.  He states his father would not want any of this and is tearful.  At this time, does not want to proceede with chest tube and will change code status to DNR in the event of decompensation.  We discussed transition to comfort care and what that would look like.  He is  waiting on his brothers to arrive and then they will discuss likely transition to comfort care.  Patient currently has very low TV on spontaneous breaths when placed on PSV and therefore if removed from MV would likely not survive long.  Will continue fentanyl gtt for now and await family decision, otherwise continue current medical care.    CCT 60 mins  Posey Boyer, AGACNP-BC [facility] Pgr: [phone] or if no answer [phone] 04/02/2017, 8:52 AM  Attending Note:  84 year old male with extensive medical history who suffered a cardiac arrest for 45 minutes in SNF who comes to [facility] for post arrest care.  On exam, he is completely unresponsive with a respiratory drive that is minimal.  I reviewed CXR myself, left sided PTX noted with trach in place.  Long conversation with family, after discussion, they do not wish for [place] to place a chest tube and opted more to comfort.  Once wife arrives then will start morphine drip and PRN ativan and disconnect from vent.  The patient is critically ill with multiple organ systems failure and requires high complexity decision making for assessment and support, frequent evaluation and titration of therapies, application of advanced monitoring technologies and extensive interpretation of multiple databases.   Critical Care Time devoted to patient care services described in this note is  35  Minutes. This time reflects time of care of this signee Dr Koren Bound. This critical care time does not reflect procedure time, or teaching time or supervisory time of PA/NP/Med student/Med Resident etc but could involve care discussion time.  Alyson Reedy, M.D. [facility] Pulmonary/Critical Care Medicine. Pager: [phone]. After hours pager: [phone].

## 2017-04-29 NOTE — Progress Notes (Signed)
Pt seen by trach team for consult. No education needed at this time. All necessary equipment available at bedside. Will continue to monitor for progress. 

## 2017-04-29 NOTE — Progress Notes (Signed)
Visited with this family per request of EOL consult. Several members of family here with patient.  Family have decided to move to comfort care for the patient.  Brother says he is ready to go be with the Lord.  Brother requested prayer so we prayed with patient.  Other family members in waiting area.  Chaplain will remain available to provide emotional and grief support as needed.  Please page us as needed.    Mar 18, 2017 1151  Clinical Encounter Type  Visited With Patient and family together  Visit Type Follow-up;Spiritual support;Critical Care  Spiritual Encounters  Spiritual Needs Prayer;Emotional;Grief support

## 2017-04-29 NOTE — Progress Notes (Signed)
Patient time of death 51427. Verified by 2 RN's. Family at bedside. Attending notified. For more information see post-mortem checklist.  [facility]

## 2017-04-29 NOTE — Progress Notes (Signed)
      INFECTIOUS DISEASE ATTENDING ADDENDUM:   Date: 04/20/2017  Patient name: Cornell BarmanDavid E Costabile  Medical record number: [id number]  Date of birth: [birth year]    Alerted to pt via VIGILANZ and we were going to consult but understand patient going to comfort care. Therefore we will not formally see the pt.  Acey LavCornelius Van Dam 04/28/2017, 8:47 AM

## 2017-04-29 NOTE — Progress Notes (Signed)
RR decreased due to prior PH of 7.6 on prior ABG.

## 2017-04-29 NOTE — Progress Notes (Signed)
Patient disconnected from mechanical ventilator per order to transition to comfort care.

## 2017-04-29 NOTE — Consult Note (Signed)
WOC consulted for pressure injury; sacrum.  Patient is transitioning to comfort care.  Discussed with bedside nurse. They have implemented the appropriate skin care protocol for a deep tissue injury. Will not follow.   Nikolos Billig [facility] MSN,RN,CWOCN, CNS, [employer] [phone]

## 2017-04-29 NOTE — Progress Notes (Signed)
Checked in on family.  The family in the room asked me to please see the spouse of the patient who is with other family members in the atrium.  Found spouse and other family members.  After spending time with her and hearing some of their story, she asked me to pray with them and for him.  We all prayed together.  Provided emotional and grief support.  They have been married 60+ years.  Thank you for allowing me to care for this family.  Chaplain will remain available for support as needed.    04/03/2017 1350  Clinical Encounter Type  Visited With Patient and family together  Visit Type Follow-up;Spiritual support;Patient actively dying;Critical Care  Spiritual Encounters  Spiritual Needs Prayer;Emotional;Grief support

## 2017-04-29 NOTE — Death Summary Note (Signed)
DEATH SUMMARY   Patient Details  Name: Alexander Archer MRN: [id number] DOB: 1930/06/03  Admission/Discharge Information   Admit Date:  03/09/2017  Date of Death: Date of Death: 01-21-2018  Time of Death: Time of Death: 1427  Length of Stay: 1  Referring Physician: Patient, No Pcp Per   Reason(s) for Hospitalization  Pulseless electrical activity cardiac arrest  Diagnoses  Preliminary cause of death:   Pulseless electrical activity cardiac arrest Secondary Diagnoses (including complications and co-morbidities):  Active Problems:   Acute respiratory failure (HCC)   Pressure injury of skin  Brief Hospital Course (including significant findings, care, treatment, and services provided and events leading to death)  Alexander Archer is a 84 y.o. year old male who with past medical history of diabetes hypertension significant cerebellar stroke recently in December with attacks of difficulty swallowing status post trach and PEG who was at the nursing home for 1 day after [facility] yesterday he was found down by the side of the bed with a cap on his trach CPR was initiated for 45minutes patient was in his usual state of health yesterday there was no fever chills rigors nausea vomiting diarrhea family think that the patient aspirated while he was in the code they could not intubate him they put him on antibiotic until he got to the ED where his trach was switched to balloon trach cuffed trach and he was put on the vent patient is unresponsive.  GCS of 5 without any sedation.  Long conversation with family, after discussion, they do not wish for us to place a chest tube and opted more to comfort.  Once wife arrives then will start morphine drip and PRN ativan and disconnect from vent.   Pertinent Labs and Studies  Significant Diagnostic Studies Ct Abdomen Pelvis Wo Contrast  Result Date: 03/14/2017 CLINICAL DATA:  Nausea and vomiting beginning yesterday. Recent  percutaneous jejunostomy tube placement. Prostate carcinoma. EXAM: CT ABDOMEN AND PELVIS WITHOUT CONTRAST TECHNIQUE: Multidetector CT imaging of the abdomen and pelvis was performed following the standard protocol without IV contrast. COMPARISON:  None. FINDINGS: Lower chest: Bilateral lower lobe infiltrates or atelectasis. Hepatobiliary: No mass visualized on this unenhanced exam. A few tiny calcified gallstones are seen, there is no evidence of cholecystitis or biliary ductal dilatation. Pancreas: No mass or inflammatory process visualized on this unenhanced exam. Spleen:  Within normal limits in size. Adrenals/Urinary tract: No evidence of urolithiasis or hydronephrosis. Small fluid attenuation subcapsular cysts in upper poles of both kidneys. Foley catheter seen in empty urinary bladder. Stomach/Bowel: No evidence of obstruction, inflammatory process, or abnormal fluid collections. Percutaneous jejunostomy tube seen in appropriate position. No evidence of free intraperitoneal air or extraluminal fluid collections. Vascular/Lymphatic: No pathologically enlarged lymph nodes identified. No evidence of abdominal aortic aneurysm. Aortic atherosclerosis. Reproductive: Postop changes from previous prostatectomy and bilateral iliac lymph node dissection. Other:  None. Musculoskeletal:  No suspicious bone lesions identified. IMPRESSION: Prior prostatectomy.  No evidence recurrent or metastatic carcinoma. Percutaneous jejunostomy in appropriate position. No evidence of free air or other complication. Cholelithiasis.  No radiographic evidence of cholecystitis. Bibasilar infiltrates versus atelectasis. Electronically Signed   By: Myles RosenthalJohn  Stahl M.D.   On: 03/14/2017 15:16   Ct Head Wo Contrast  Result Date: 03/25/2017 CLINICAL DATA:  84 year old male found down, cardiac arrest, status post 45 min of CPR. Recent right cerebellar infarct. EXAM: CT HEAD WITHOUT CONTRAST CT CERVICAL SPINE WITHOUT CONTRAST TECHNIQUE:  Multidetector CT imaging of  the head and cervical spine was performed following the standard protocol without intravenous contrast. Multiplanar CT image reconstructions of the cervical spine were also generated. COMPARISON:  Head CT without contrast 03/22/2017, brain MRI 03/16/2017 FINDINGS: CT HEAD FINDINGS Brain: Stable to mildly decreased hypodensity in the inferior right cerebellar hemisphere, right PICA territory. Stable involvement of the right lateral medulla. No posterior fossa mass effect. Stable gray-white matter differentiation elsewhere. No midline shift, ventriculomegaly, mass effect, evidence of mass lesion, intracranial hemorrhage or new cortically based acute infarction. Vascular: Calcified atherosclerosis at the skull base. No suspicious intracranial vascular hyperdensity; Basilar artery density today is stable compared to 03/13/2017 Skull: Stable.  No acute osseous abnormality identified. Sinuses/Orbits: Decreased fluid in the right sphenoid sinus. Other visible paranasal sinuses and mastoids are stable and well pneumatized. Other: No acute orbit or scalp soft tissue findings. Fluid layering in the pharynx. CT CERVICAL SPINE FINDINGS Alignment: Preserved cervical lordosis. Bilateral posterior element alignment is within normal limits. Cervicothoracic junction alignment is within normal limits. Skull base and vertebrae: Visualized skull base is intact. No atlanto-occipital dissociation. No acute cervical spine fracture. Soft tissues and spinal canal: No prevertebral fluid or swelling. No visible canal hematoma. Disc levels: Widespread cervical spine degeneration. Bulky anterior endplate osteophytes in the lower cervical spine. Upper chest: Visible upper thoracic levels appear intact. Negative lung apices. Other: Tracheostomy tube in place. Small volume fluid layering in the larynx just above the tube. Small volume of fluid in the nasopharynx. Partially retropharyngeal course of the right ICA.  IMPRESSION: 1. No acute intracranial abnormality. Expected evolution of the right PICA infarct since 03/16/2017. 2.  No acute fracture in the cervical spine. 3. Tracheostomy tube in place. Small volume of fluid above the to in the larynx and pharynx. Electronically Signed   By: Odessa Fleming M.D.   On: 03/13/2017 11:35   Ct Head Wo Contrast  Result Date: 03/22/2017 CLINICAL DATA:  Altered level of consciousness, unresponsive EXAM: CT HEAD WITHOUT CONTRAST TECHNIQUE: Contiguous axial images were obtained from the base of the skull through the vertex without intravenous contrast. COMPARISON:  MRI 03/16/2017 FINDINGS: Brain: There is atrophy and chronic small vessel disease changes. Low-density noted in the inferior right cerebellar hemisphere in the area of prior infarction seen by MRI. No hemorrhage. No new infarction. No hydrocephalus. Vascular: No hyperdense vessel or unexpected calcification. Skull: No acute calvarial abnormality. Sinuses/Orbits: Air-fluid level in the right sphenoid sinus. Remainder the paranasal sinuses and mastoids clear. Orbital soft tissues unremarkable. Other: None IMPRESSION: Area of infarction again noted in the right inferior cerebellar hemisphere as seen on prior MRI. No additional acute intracranial abnormality. Electronically Signed   By: Charlett Nose M.D.   On: 03/22/2017 09:35   Ct Head Wo Contrast  Result Date: 03/13/2017 CLINICAL DATA:  CVA. EXAM: CT HEAD WITHOUT CONTRAST TECHNIQUE: Contiguous axial images were obtained from the base of the skull through the vertex without intravenous contrast. COMPARISON:  None. FINDINGS: Brain: The ventricles are in the midline without mass effect or shift. Age related cerebral atrophy, ventriculomegaly and periventricular white matter disease. No extra-axial fluid collections are identified. No findings for hemispheric infarction or intracranial hemorrhage. Low attenuation in the right cerebellar hemisphere consistent with cerebellar infarct.  No hemorrhage. Probable infarct in the lower right brainstem also. Vascular: Vascular calcifications are noted. Suspect hyperdense right vertebral artery. Skull: No skull fracture or bone lesion. Sinuses/Orbits: Right half sphenoid sinus disease. The other paranasal sinuses are clear and the mastoid air cells and  middle ear cavities are clear. The globes are intact. Other: No scalp lesions or hematoma. IMPRESSION: 1. Right cerebellar and right lower brainstem infarcts. Probable small focus of hemorrhage in the lower right cerebellum. Hyperdense right vertebral artery. 2. No supratentorial infarction. 3. Age related cerebral atrophy, ventriculomegaly and periventricular white matter disease. Electronically Signed   By: Rudie Meyer M.D.   On: 03/13/2017 20:41   Ct Cervical Spine Wo Contrast  Result Date: 03/04/2017 CLINICAL DATA:  84 year old male found down, cardiac arrest, status post 45 min of CPR. Recent right cerebellar infarct. EXAM: CT HEAD WITHOUT CONTRAST CT CERVICAL SPINE WITHOUT CONTRAST TECHNIQUE: Multidetector CT imaging of the head and cervical spine was performed following the standard protocol without intravenous contrast. Multiplanar CT image reconstructions of the cervical spine were also generated. COMPARISON:  Head CT without contrast 03/22/2017, brain MRI 03/16/2017 FINDINGS: CT HEAD FINDINGS Brain: Stable to mildly decreased hypodensity in the inferior right cerebellar hemisphere, right PICA territory. Stable involvement of the right lateral medulla. No posterior fossa mass effect. Stable gray-white matter differentiation elsewhere. No midline shift, ventriculomegaly, mass effect, evidence of mass lesion, intracranial hemorrhage or new cortically based acute infarction. Vascular: Calcified atherosclerosis at the skull base. No suspicious intracranial vascular hyperdensity; Basilar artery density today is stable compared to 03/13/2017 Skull: Stable.  No acute osseous abnormality identified.  Sinuses/Orbits: Decreased fluid in the right sphenoid sinus. Other visible paranasal sinuses and mastoids are stable and well pneumatized. Other: No acute orbit or scalp soft tissue findings. Fluid layering in the pharynx. CT CERVICAL SPINE FINDINGS Alignment: Preserved cervical lordosis. Bilateral posterior element alignment is within normal limits. Cervicothoracic junction alignment is within normal limits. Skull base and vertebrae: Visualized skull base is intact. No atlanto-occipital dissociation. No acute cervical spine fracture. Soft tissues and spinal canal: No prevertebral fluid or swelling. No visible canal hematoma. Disc levels: Widespread cervical spine degeneration. Bulky anterior endplate osteophytes in the lower cervical spine. Upper chest: Visible upper thoracic levels appear intact. Negative lung apices. Other: Tracheostomy tube in place. Small volume fluid layering in the larynx just above the tube. Small volume of fluid in the nasopharynx. Partially retropharyngeal course of the right ICA. IMPRESSION: 1. No acute intracranial abnormality. Expected evolution of the right PICA infarct since 03/16/2017. 2.  No acute fracture in the cervical spine. 3. Tracheostomy tube in place. Small volume of fluid above the to in the larynx and pharynx. Electronically Signed   By: Odessa Fleming M.D.   On: 03/20/2017 11:35   Mr Brain Wo Contrast  Result Date: 03/16/2017 CLINICAL DATA:  84 y/o  M; altered mental status, rule out stroke. EXAM: MRI HEAD WITHOUT CONTRAST TECHNIQUE: Multiplanar, multiecho pulse sequences of the brain and surrounding structures were obtained without intravenous contrast. COMPARISON:  03/13/2016 CT head. FINDINGS: Brain: Susceptibility hypointensity within the right inferior cerebellum PICA territory and intermediate diffusion within the right lateral medulla with small focus of susceptibility hypointensity in similar distribution comparison with prior CT. No mass effect. Findings compatible  with hemorrhagic subacute infarction. Fewnonspecific foci of T2 FLAIR hyperintense signal abnormality in subcortical and periventricular white matter are compatible withmildchronic microvascular ischemic changes for age. Mildbrain parenchymal volume loss. No extra-axial collection, hydrocephalus, or effacement of basilar cisterns. Vascular: Loss of flow void in the right vertebral artery may represent thrombus or dissection. Otherwise negative. Skull and upper cervical spine: Normal marrow signal. Sinuses/Orbits: Partial opacification of mastoid air cells. Fluid levels in the sphenoid and left maxillary sinus. Mild mucosal thickening of  ethmoid air cells. Bilateral intra-ocular lens replacement. Other: None. IMPRESSION: 1. Hemorrhagic subacute infarction within right inferior cerebellar hemisphere and right lateral medulla, PICA territory. No mass effect. Stable distribution in comparison with prior CT given differences in technique. 2. Right vertebral artery signal abnormality may represent thrombus or dissection. 3. Mild chronic microvascular ischemic changes and mild parenchymal volume loss of the brain. 4. Fluid levels and sphenoid and left maxillary sinus may represent acute sinusitis. Electronically Signed   By: Mitzi Hansen M.D.   On: 03/16/2017 03:07   US Renal  Result Date: 03/12/2017 CLINICAL DATA:  Acute renal failure EXAM: RENAL / URINARY TRACT ULTRASOUND COMPLETE COMPARISON:  None. FINDINGS: Right Kidney: Length: 13.1 cm.  Mild caliectasis on the right.  No mass. Left Kidney: Length: 12.9 cm. There is a 1.3 x 1.5 x 1.6 cm mass associated with the upper pole of the left kidney, indeterminate on this study. There is moderate left-sided hydronephrosis. Bladder: The prevoid volume of the bladder is 879 cc. The patient could not void. No gross bladder abnormalities. IMPRESSION: 1. There is a 1.3 x 1.5 x 1.6 cm mass associated with the upper pole of the left kidney. This is indeterminate on  this study but could represent a complicated cyst. An MRI could definitively characterize. Alternatively, a follow-up ultrasound could ensure stability. 2. Mild caliectasis associated with the right kidney and moderate hydronephrosis on the left. These findings are age indeterminate. 3. Distended bladder.  The patient was unable to void. Electronically Signed   By: Gerome Sam III M.D   On: 03/12/2017 07:03   Ir Replc Duoden/jejuno Tamsen Snider Percut W/fluoro  Result Date: 03/11/2017 INDICATION: 84 year old male with a percutaneous jejunostomy tube which has been inadvertently displaced. EXAM: IR REPLACE DUODEN/JEJUNO TUBE PERCUT WITH FLOURO MEDICATIONS: None ANESTHESIA/SEDATION: None CONTRAST:  10 mL Isovue-300-administered into the enteric lumen. FLUOROSCOPY TIME:  Fluoroscopy Time: 0 minutes 0 seconds (0 mGy). COMPLICATIONS: None immediate. PROCEDURE: Informed written consent was obtained from the patient after a thorough discussion of the procedural risks, benefits and alternatives. All questions were addressed. Maximal Sterile Barrier Technique was utilized including caps, mask, sterile gowns, sterile gloves, sterile drape, hand hygiene and skin antiseptic. A timeout was performed prior to the initiation of the procedure. An 54 French jejunostomy tube was cut to length and advanced through the ostomy and into the intestine. The balloon was inflated with 9 mL sterile saline. A hand injection of contrast material under brief fluoroscopy opacifies the stomach. An image was saved. The external bumper was placed against the skin and sewn to the skin with 4 separate 0 Prolene sutures. The external bumper was then fixed to the jejunostomy tube using an 0 silk suture. IMPRESSION: Successful replacement of an 48 French percutaneous jejunostomy tube. Electronically Signed   By: Malachy Moan M.D.   On: 03/11/2017 17:29   Dg Abdomen Peg Tube Location  Result Date: 03/09/2017 CLINICAL DATA:  PEG tube replacement  EXAM: ABDOMEN - 1 VIEW COMPARISON:  Portable exam 1619 hours compared to 03/08/2017 FINDINGS: Contrast has been injected via the urostomy tube. Contrast opacifies small bowel loops in the RIGHT mid abdomen. No gastric opacification. This appears to be a jejunostomy tube rather than a PEG tube. Nonobstructive bowel gas pattern. Osseous structures unremarkable. Surgical clips in pelvis. IMPRESSION: Contrast injected via an enterostomy tube opacifies small bowel loops in the RIGHT mid abdomen consistent with jejunostomy tube. Electronically Signed   By: Ulyses Southward M.D.   On: 03/09/2017 17:02  Dg Chest Port 1 View  Result Date: 04/21/2017 CLINICAL DATA:  Followup ventilator support. EXAM: PORTABLE CHEST 1 VIEW COMPARISON:  03/14/2017 FINDINGS: Tracheostomy in place. New left pneumothorax, 25-30% in volume. No sign of tension. Right chest remains clear. IMPRESSION: New left pneumothorax, 25-30% without evidence of tension. I am in the process of calling this report. Electronically Signed   By: Paulina Fusi M.D.   On: 04-21-17 07:47   Dg Chest Portable 1 View  Result Date: 03/18/2017 CLINICAL DATA:  Respiratory failure.  Follow-up CPR. EXAM: PORTABLE CHEST 1 VIEW COMPARISON:  03/16/2017 FINDINGS: Tracheostomy remains in place. Nasogastric tube no longer present. Heart size is normal. Aortic atherosclerosis as seen previously. The lungs are clear. The vascularity is normal. No effusions. No bone abnormality seen. IMPRESSION: Tracheostomy.  No active disease evident by radiography. Electronically Signed   By: Paulina Fusi M.D.   On: 03/30/2017 10:13   Dg Chest Port 1 View  Result Date: 03/16/2017 CLINICAL DATA:  Respiratory failure EXAM: PORTABLE CHEST 1 VIEW COMPARISON:  03/15/2017 FINDINGS: Stable tracheostomy tube. NG tube placed. Tip is beyond the gastroesophageal junction. Low volumes with bilateral scattered atelectasis. Normal heart size. IMPRESSION: Stable tracheostomy tube. NG tube is beyond the  gastroesophageal junction. Scattered bilateral atelectasis. Electronically Signed   By: Jolaine Click M.D.   On: 03/16/2017 19:44   Dg Chest Port 1 View  Result Date: 03/15/2017 CLINICAL DATA:  Fever, possible pneumonia, vomiting through tracheostomy. EXAM: PORTABLE CHEST 1 VIEW COMPARISON:  Portable chest x-ray of March 13, 2017 FINDINGS: The lungs are reasonably well inflated. The lung markings are coarse in the right infrahilar region but are fairly stable. The retrocardiac lung markings are mildly increased. The heart the is top-normal in size. The pulmonary vascularity is not engorged. The tracheostomy tube tip projects at the inferior margin of the clavicular heads. The observed bony thorax is unremarkable. IMPRESSION: Coarse lung markings in the right infrahilar and left retrocardiac regions. Those on the right are slightly more conspicuous than in the past but this may be due to patient positioning. On the left they appear new and may reflect atelectasis or aspiration pneumonia. Electronically Signed   By: Rodgerick  [place] M.D.   On: 03/15/2017 09:33   Dg Chest Port 1 View  Result Date: 03/13/2017 CLINICAL DATA:  Pneumonia. EXAM: PORTABLE CHEST 1 VIEW COMPARISON:  02/26/2017 FINDINGS: Tracheostomy. Midline trachea. Normal heart size. No pleural effusion or pneumothorax. Improved right infrahilar opacity. Persistent soft tissue fullness centrally. Clear left lung. IMPRESSION: Improved right infrahilar airspace disease with central density remaining. Comparison with prior more remote radiographs would be informative. If not, recommend radiographic follow-up in 1-2 weeks to confirm complete clearing and exclude underlying mass or adenopathy. Electronically Signed   By: Jeronimo Greaves M.D.   On: 03/13/2017 13:35   Dg Abd Portable 1v  Result Date: 03/18/2017 CLINICAL DATA:  abd pain EXAM: PORTABLE ABDOMEN - 1 VIEW COMPARISON:  CT 03/14/2017 and previous FINDINGS: Nasogastric tube into the decompressed  stomach. Small bowel is nondilated. Residual contrast material in the nondistended colon. Stable jejunostomy catheter. Midline sutures overlie the pelvis. Vascular clips along the pelvic sidewalls. Mild degenerative changes in the lower lumbar spine. IMPRESSION: 1. Nasogastric tube into the decompressed stomach. 2.  Nonobstructive bowel gas pattern. Electronically Signed   By: Corlis Leak M.D.   On: 03/18/2017 09:38   Dg Abd Portable 1v  Result Date: 03/13/2017 CLINICAL DATA:  Ileus EXAM: PORTABLE ABDOMEN - 1 VIEW COMPARISON:  Radiograph 03/08/2017 FINDINGS: Linear metallic opacity to the right of the spine. Jejunostomy tube tip in the right lower quadrant. Contrast material in the colon. Nonobstructed gas pattern. Surgical changes in the pelvis. IMPRESSION: Nonobstructed gas pattern with contrast material in the colon. Electronically Signed   By: Jasmine Pang M.D.   On: 03/13/2017 01:15   Dg Abd Portable 1v  Result Date: 03/08/2017 CLINICAL DATA:  Abdominal distention. EXAM: PORTABLE ABDOMEN - 1 VIEW COMPARISON:  02/26/2017. FINDINGS: Postsurgical changes over the pelvis again noted. Enterostomy tube again noted. Distended loops of small bowel are noted. Mild small bowel wall fold thickening cannot be excluded. A process such as enteritis or ischemia cannot be excluded. Mottled appearance noted over the bowel. Pneumatosis intestinalis and colonic cannot be completely excluded. Changes may be related overlying stool. Follow-up abdominal series suggested. CT of the abdomen pelvis may prove useful for further evaluation. No free air noted. Lumbar spine scoliosis and degenerative change. IMPRESSION: 1.  Enterostomy tube noted in stable position. 2. Distended loops of small bowel noted. Slight small bowel wall fold thickening cannot be excluded. A process such as enteritis or ischemia cannot be excluded. Mottled appearance noted over the bowel. Pneumatosis intestinalis and coli cannot be completely excluded.  Changes may be related overlying stool. Follow-up abdominal series suggested. CT of the abdomen pelvis may prove useful for further evaluation. These results will be called to the ordering clinician or representative by the Radiologist Assistant, and communication documented in the PACS or zVision Dashboard. Electronically Signed   By: Maisie Fus  Register   On: 03/08/2017 07:52    Microbiology Recent Results (from the past 240 hour(s))  Culture, blood (Routine X 2) w Reflex to ID Panel     Status: None   Collection Time: 03/25/2017 10:15 AM  Result Value Ref Range Status   Specimen Description BLOOD LEFT HAND  Final   Special Requests IN PEDIATRIC BOTTLE Blood Culture adequate volume  Final   Culture   Final    NO GROWTH 5 DAYS Performed at [facility], [address] [place], [place] [address]    Report Status 04/05/2017 FINAL  Final  Culture, blood (Routine X 2) w Reflex to ID Panel     Status: Abnormal   Collection Time: 03/03/2017 10:40 AM  Result Value Ref Range Status   Specimen Description BLOOD RIGHT FEMORAL ARTERY  Final   Special Requests   Final    BOTTLES DRAWN AEROBIC AND ANAEROBIC Blood Culture results may not be optimal due to an excessive volume of blood received in culture bottles   Culture  Setup Time   Final    GRAM POSITIVE COCCI IN CHAINS AEROBIC BOTTLE ONLY CRITICAL RESULT CALLED TO, READ BACK BY AND VERIFIED WITH: J LEDFORD PHARMD [id number] 2017-04-26 BY A BROWNING    Culture (A)  Final    ENTEROCOCCUS FAECALIS STAPHYLOCOCCUS SPECIES (COAGULASE NEGATIVE) THE SIGNIFICANCE OF ISOLATING THIS ORGANISM FROM A SINGLE SET OF BLOOD CULTURES WHEN MULTIPLE SETS ARE DRAWN IS UNCERTAIN. PLEASE NOTIFY THE MICROBIOLOGY DEPARTMENT WITHIN ONE WEEK IF SPECIATION AND SENSITIVITIES ARE REQUIRED. Performed at [facility], [address] [place], [place] [address]    Report Status 04/03/2017 FINAL  Final   Organism ID, Bacteria ENTEROCOCCUS FAECALIS  Final      Susceptibility    Enterococcus faecalis - MIC*    AMPICILLIN <=2 SENSITIVE Sensitive     VANCOMYCIN 1 SENSITIVE Sensitive     GENTAMICIN SYNERGY SENSITIVE Sensitive     *  ENTEROCOCCUS FAECALIS  Blood Culture ID Panel (Reflexed)     Status: Abnormal   Collection Time: 03/04/2017 10:40 AM  Result Value Ref Range Status   Enterococcus species DETECTED (A) NOT DETECTED Final    Comment: CRITICAL RESULT CALLED TO, READ BACK BY AND VERIFIED WITH: J [facility] PHARMD [id number] 04-03-17 A BROWNING    Vancomycin resistance NOT DETECTED NOT DETECTED Final   Listeria monocytogenes NOT DETECTED NOT DETECTED Final   Staphylococcus species DETECTED (A) NOT DETECTED Final    Comment: Methicillin (oxacillin) resistant coagulase negative staphylococcus. Possible blood culture contaminant (unless isolated from more than one blood culture draw or clinical case suggests pathogenicity). No antibiotic treatment is indicated for blood  culture contaminants. CRITICAL RESULT CALLED TO, READ BACK BY AND VERIFIED WITH: J LEDFORD PHARMD [id number] 2017-04-03 A BROWNING    Staphylococcus aureus NOT DETECTED NOT DETECTED Final   Methicillin resistance DETECTED (A) NOT DETECTED Final    Comment: CRITICAL RESULT CALLED TO, READ BACK BY AND VERIFIED WITH: J LEDFORD PHARMD [id number] 04/03/17 A BROWNING    Streptococcus species NOT DETECTED NOT DETECTED Final   Streptococcus agalactiae NOT DETECTED NOT DETECTED Final   Streptococcus pneumoniae NOT DETECTED NOT DETECTED Final   Streptococcus pyogenes NOT DETECTED NOT DETECTED Final   Acinetobacter baumannii NOT DETECTED NOT DETECTED Final   Enterobacteriaceae species NOT DETECTED NOT DETECTED Final   Enterobacter cloacae complex NOT DETECTED NOT DETECTED Final   Escherichia coli NOT DETECTED NOT DETECTED Final   Klebsiella oxytoca NOT DETECTED NOT DETECTED Final   Klebsiella pneumoniae NOT DETECTED NOT DETECTED Final   Proteus species NOT DETECTED NOT DETECTED Final   Serratia marcescens NOT DETECTED NOT  DETECTED Final   Haemophilus influenzae NOT DETECTED NOT DETECTED Final   Neisseria meningitidis NOT DETECTED NOT DETECTED Final   Pseudomonas aeruginosa NOT DETECTED NOT DETECTED Final   Candida albicans NOT DETECTED NOT DETECTED Final   Candida glabrata NOT DETECTED NOT DETECTED Final   Candida krusei NOT DETECTED NOT DETECTED Final   Candida parapsilosis NOT DETECTED NOT DETECTED Final   Candida tropicalis NOT DETECTED NOT DETECTED Final    Comment: Performed at [facility], [address] [place], [place] [address]  Urine culture     Status: None   Collection Time: 03/30/2017 11:16 AM  Result Value Ref Range Status   Specimen Description URINE, CATHETERIZED  Final   Special Requests NONE  Final   Culture   Final    NO GROWTH Performed at [facility], [address], [place], [place] [address]    Report Status 04-03-17 FINAL  Final  MRSA PCR Screening     Status: None   Collection Time: 03/16/2017  8:57 PM  Result Value Ref Range Status   MRSA by PCR NEGATIVE NEGATIVE Final    Comment:        The GeneXpert MRSA Assay (FDA approved for NASAL specimens only), is one component of a comprehensive MRSA colonization surveillance program. It is not intended to diagnose MRSA infection nor to guide or monitor treatment for MRSA infections.     Lab Basic Metabolic Panel: Recent Labs  Lab 03/26/2017 1044 03/27/2017 1545 Apr 03, 2017 0227  NA 139  --  141  K 4.5  --  4.4  CL 97*  --  108  CO2 27  --  24  GLUCOSE 223*  --  126*  BUN 21*  --  29*  CREATININE 1.03 1.09 1.43*  CALCIUM 8.5*  --  7.9*  MG  --   --  1.7   Liver Function Tests: Recent Labs  Lab 03/18/2017 1044 2017-04-05 0227  AST 56* 115*  ALT 41 44  ALKPHOS 83 55  BILITOT 0.6 1.2  PROT 7.8 5.7*  ALBUMIN 3.3* 2.5*   No results for input(s): LIPASE, AMYLASE in the last 168 hours. No results for input(s): AMMONIA in the last 168 hours. CBC: Recent Labs  Lab 03/30/2017 1044 April 05, 2017 0227  WBC  24.4* 10.0  NEUTROABS 21.4* 6.0  HGB 13.6 9.5*  HCT 42.2 29.6*  MCV 98.8 95.2  PLT 258 177   Cardiac Enzymes: Recent Labs  Lab 03/24/2017 1044  TROPONINI <0.03   Sepsis Labs: Recent Labs  Lab 03/09/2017 1044 03/21/2017 1100 03/30/2017 1241 03/27/2017 2252 04-05-17 0227  WBC 24.4*  --   --   --  10.0  LATICACIDVEN  --  6.96* 5.4* 3.1* 2.1*    Procedures/Operations    Izaya Netherton 04/06/2017, 2:57 PM

## 2017-04-29 NOTE — Significant Event (Signed)
PCCM Interval Note  Two remaining son's arrived with additional family.  Family meeting held and update given.  All three son's agree that patient would not want this level of life support and wish to transition to full comfort care.  They acknowledge patient has had a decline in his health with recent CVA and now after prolonged cardiac arrest yesterday.  Will transfer fentanyl to morphine gtt along with ativan prn.  Sons to bring patient's wife to bedside, family to visit, and then will remove from mechanical ventilator when family is ready.  Ongoing support given.  Posey BoyerBrooke Kenlynn Houde, AGACNP-BC [facility] Pgr: [phone] or if no answer [phone] 04/14/2017, 10:30 AM

## 2017-04-29 NOTE — Progress Notes (Signed)
100 ml Fentanyl and 175 ml Morphine wasted in sink with April Lewis, RN.

## 2017-04-29 NOTE — Progress Notes (Signed)
Nutrition Brief Note  Chart reviewed. Pt now transitioning to comfort care.  No nutrition interventions warranted at this time.  Please consult as needed.   Derick Seminara Siegrist, RD, LDN, CNSC Pager [phone] After Hours Pager [phone]    

## 2017-04-29 NOTE — Progress Notes (Signed)
PHARMACY - PHYSICIAN COMMUNICATION CRITICAL VALUE ALERT - BLOOD CULTURE IDENTIFICATION (BCID)  Cornell BarmanDavid E Klippel is an 84 y.o. male who [facility] with a chief complaint of respiratory distress, s/p CPR with ROSC  Name of physician (or Provider) Contacted: Lanora ManisElizabeth Deterding ([facility]/CCM)  Current antibiotics: Vancomycin/Zosyn  Changes to prescribed antibiotics recommended:  Cont current antibiotics   Results for orders placed or performed during the hospital encounter of 03/30/2017  Blood Culture ID Panel (Reflexed) (Collected: 03/27/2017 10:40 AM)  Result Value Ref Range   Enterococcus species DETECTED (A) NOT DETECTED   Vancomycin resistance NOT DETECTED NOT DETECTED   Listeria monocytogenes NOT DETECTED NOT DETECTED   Staphylococcus species DETECTED (A) NOT DETECTED   Staphylococcus aureus NOT DETECTED NOT DETECTED   Methicillin resistance DETECTED (A) NOT DETECTED   Streptococcus species NOT DETECTED NOT DETECTED   Streptococcus agalactiae NOT DETECTED NOT DETECTED   Streptococcus pneumoniae NOT DETECTED NOT DETECTED   Streptococcus pyogenes NOT DETECTED NOT DETECTED   Acinetobacter baumannii NOT DETECTED NOT DETECTED   Enterobacteriaceae species NOT DETECTED NOT DETECTED   Enterobacter cloacae complex NOT DETECTED NOT DETECTED   Escherichia coli NOT DETECTED NOT DETECTED   Klebsiella oxytoca NOT DETECTED NOT DETECTED   Klebsiella pneumoniae NOT DETECTED NOT DETECTED   Proteus species NOT DETECTED NOT DETECTED   Serratia marcescens NOT DETECTED NOT DETECTED   Haemophilus influenzae NOT DETECTED NOT DETECTED   Neisseria meningitidis NOT DETECTED NOT DETECTED   Pseudomonas aeruginosa NOT DETECTED NOT DETECTED   Candida albicans NOT DETECTED NOT DETECTED   Candida glabrata NOT DETECTED NOT DETECTED   Candida krusei NOT DETECTED NOT DETECTED   Candida parapsilosis NOT DETECTED NOT DETECTED   Candida tropicalis NOT DETECTED NOT DETECTED    Abran DukeLedford,  Lanier Millon 04/13/2017  7:25 AM

## 2017-04-29 NOTE — Progress Notes (Addendum)
CSW consulted for Comfort Care at this time. CSW remains available for support as needed.     Claude MangesKierra S. Ciearra Rufo, MSW, LCSW-A Emergency Department Clinical Social Worker [phone]

## 2017-04-29 DEATH — deceased

## 2019-08-12 IMAGING — DX DG CHEST 1V PORT
1 series · 1 of 1 positions shown · non-contrast
Comparison: None.

CLINICAL DATA: 86-year-old male with respiratory failure.

EXAM:
PORTABLE CHEST 1 VIEW

[chest]
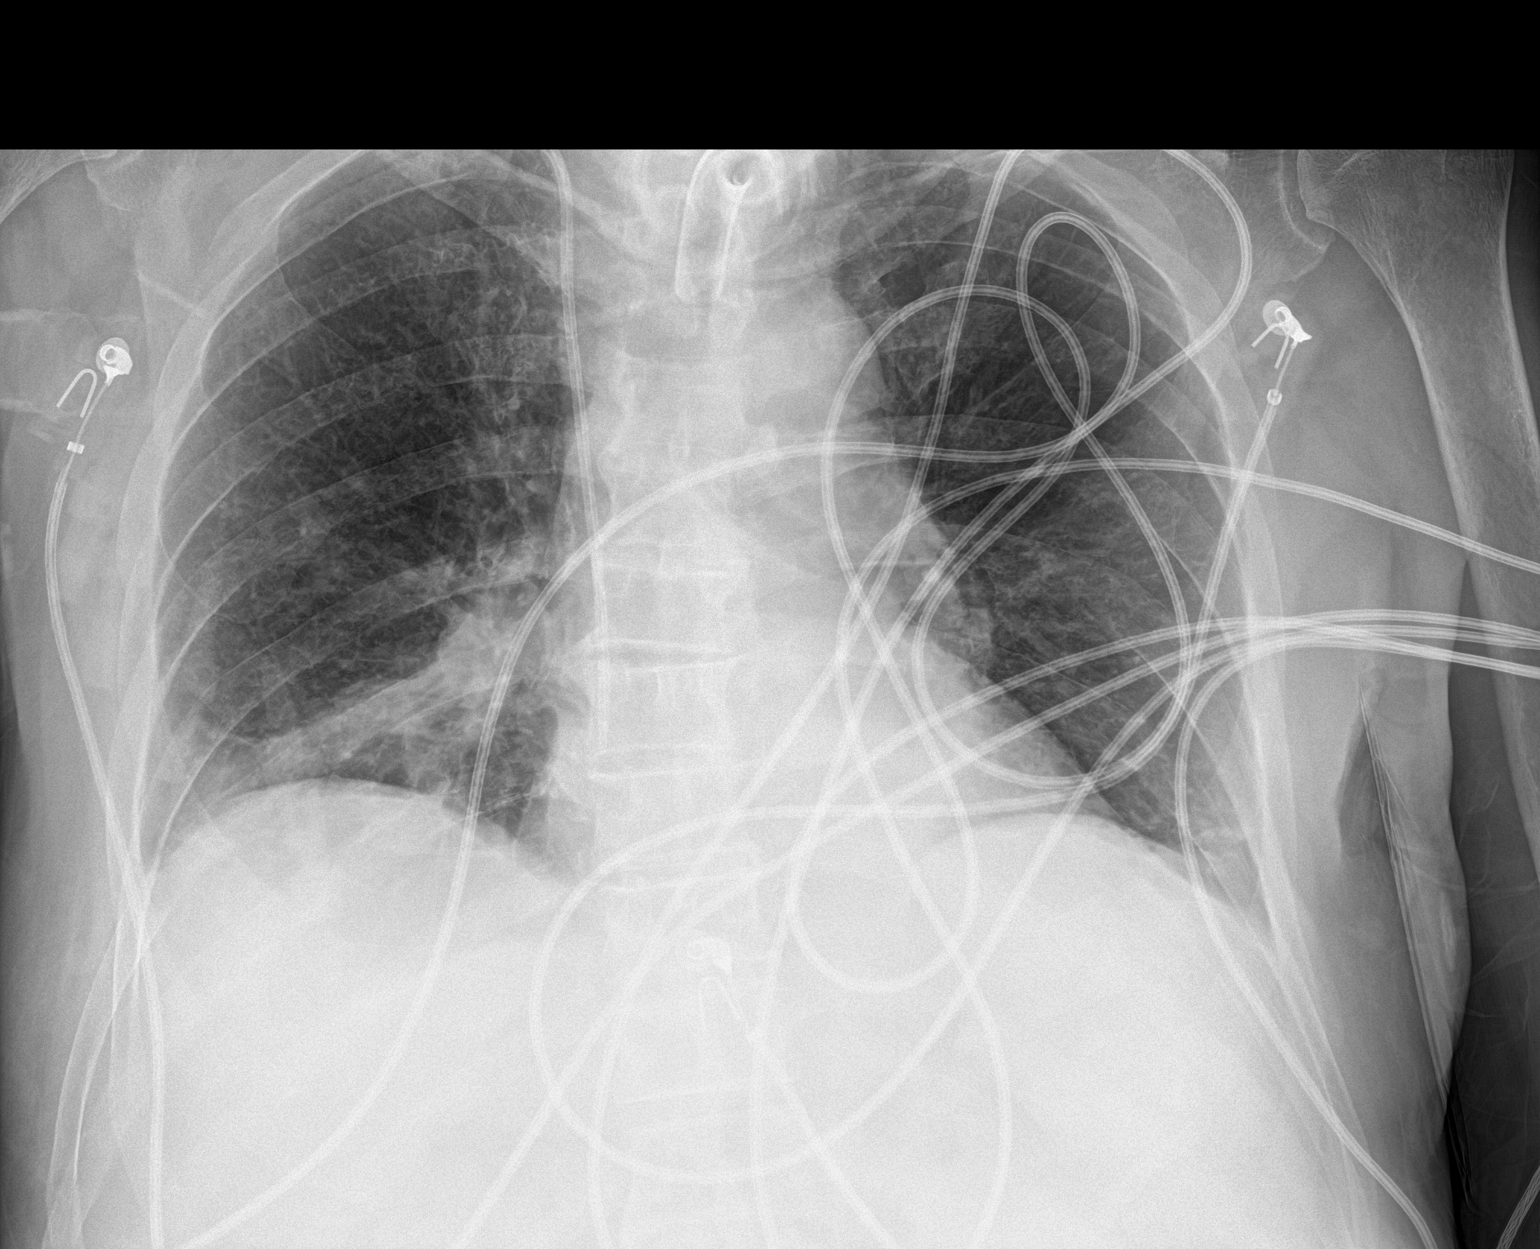

[1 of 1 positions shown; findings below may reference images not displayed]

FINDINGS: Tracheostomy with tip above the carina. Right IJ central venous line
with tip over right atrium. Right lung base density may represent
atelectatic changes/scarring versus infiltrate. Clinical correlation
is recommended. The left lung is clear. There is no pleural effusion
or pneumothorax. The cardiac silhouette is within normal limits. No
acute osseous pathology.
IMPRESSION: Right lung base atelectasis versus infiltrate. Clinical correlation
is recommended.

## 2019-08-23 IMAGING — DX DG ABDOMEN 1V
1 series · 1 of 1 positions shown · non-contrast
Comparison: Portable exam 0809 hours compared to 03/08/2017

CLINICAL DATA: PEG tube replacement

EXAM:
ABDOMEN - 1 VIEW

[abdomen kub]
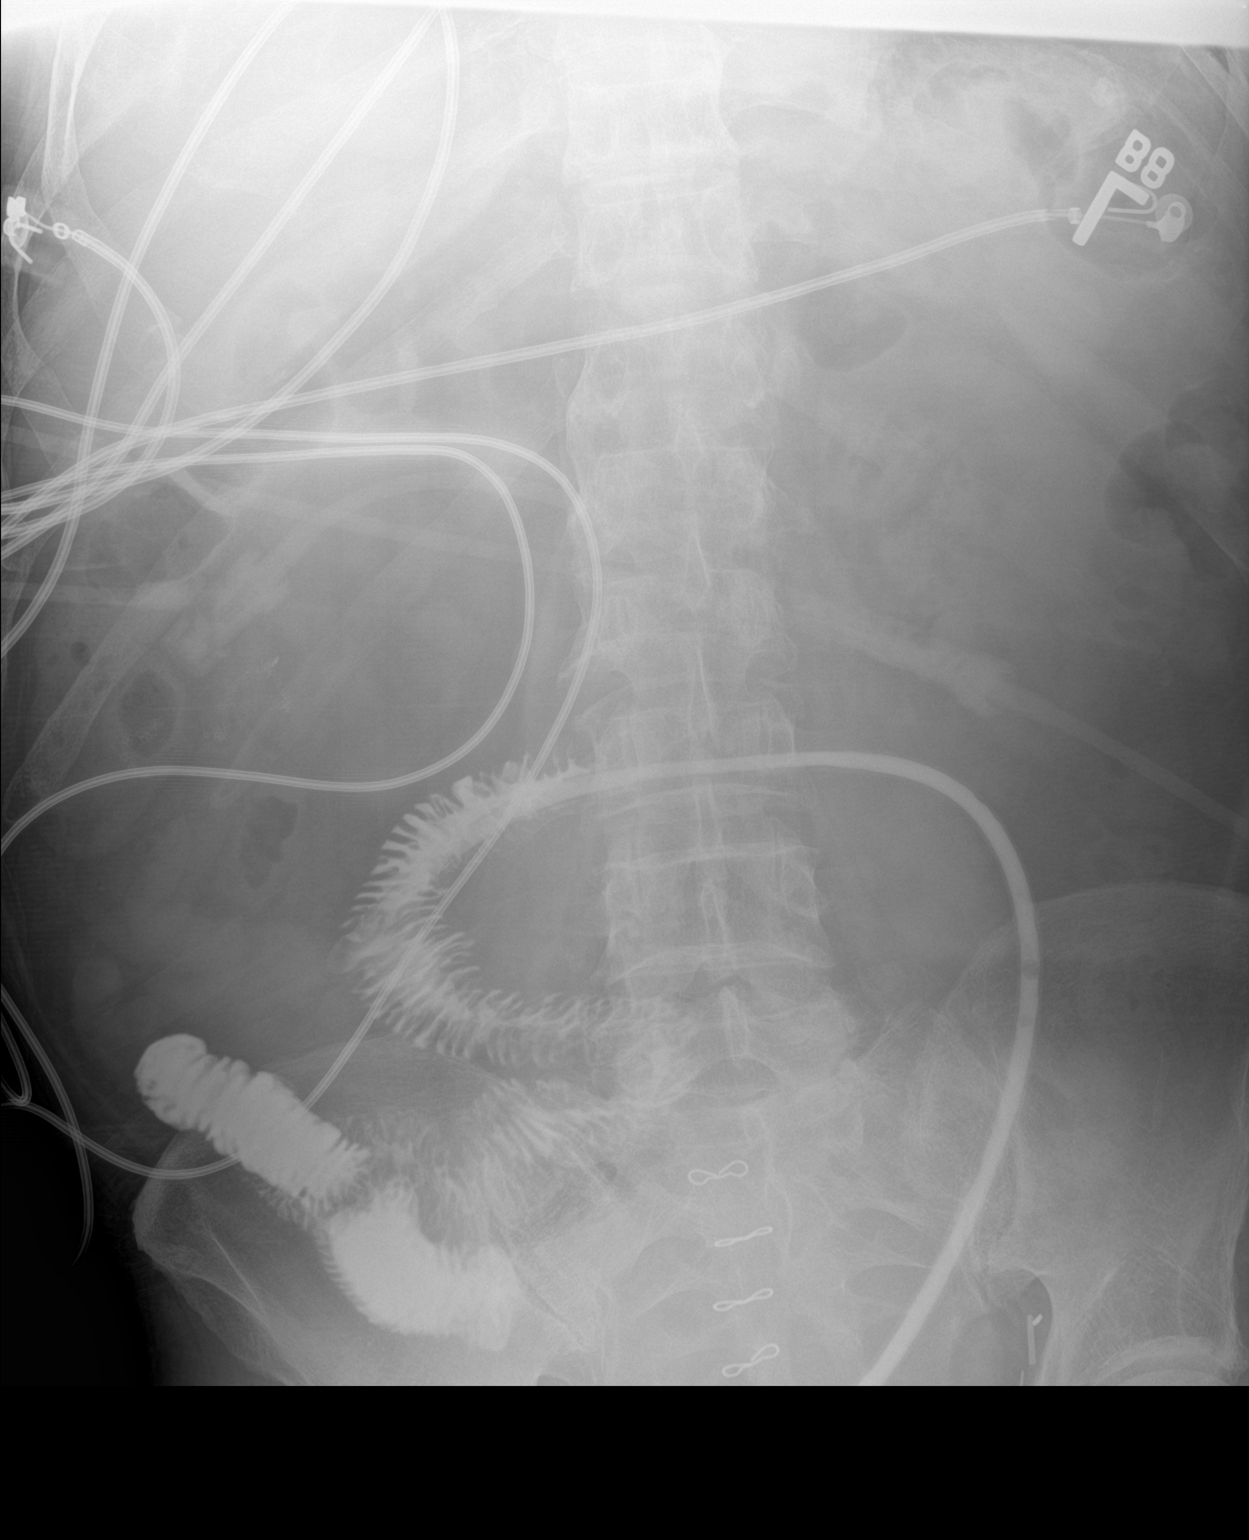

[1 of 1 positions shown; findings below may reference images not displayed]

FINDINGS: Contrast has been injected via the urostomy tube.

Contrast opacifies small bowel loops in the RIGHT mid abdomen.

No gastric opacification.

This appears to be a jejunostomy tube rather than a PEG tube.

Nonobstructive bowel gas pattern.

Osseous structures unremarkable.

Surgical clips in pelvis.
IMPRESSION: Contrast injected via an enterostomy tube opacifies small bowel
loops in the RIGHT mid abdomen consistent with jejunostomy tube.
